# Patient Record
Sex: Male | Born: 1980
Health system: Southern US, Community
[De-identification: ages and names within clinical notes are randomized; demographics above are authoritative.]

## PROBLEM LIST (undated history)

## (undated) DIAGNOSIS — F32A Depression, unspecified: Secondary | ICD-10-CM

## (undated) DIAGNOSIS — J45909 Unspecified asthma, uncomplicated: Secondary | ICD-10-CM

## (undated) DIAGNOSIS — F329 Major depressive disorder, single episode, unspecified: Secondary | ICD-10-CM

## (undated) DIAGNOSIS — IMO0002 Reserved for concepts with insufficient information to code with codable children: Secondary | ICD-10-CM

## (undated) HISTORY — PX: MOUTH SURGERY: SHX715

---

## 1999-09-04 ENCOUNTER — Encounter: Payer: Self-pay | Admitting: Emergency Medicine

## 1999-09-04 ENCOUNTER — Emergency Department (HOSPITAL_COMMUNITY): Admission: EM | Admit: 1999-09-04 | Discharge: 1999-09-04 | Payer: Self-pay | Admitting: Emergency Medicine

## 2000-02-26 ENCOUNTER — Emergency Department (HOSPITAL_COMMUNITY): Admission: EM | Admit: 2000-02-26 | Discharge: 2000-02-26 | Payer: Self-pay | Admitting: Emergency Medicine

## 2000-02-27 ENCOUNTER — Encounter: Payer: Self-pay | Admitting: Emergency Medicine

## 2000-05-30 ENCOUNTER — Emergency Department (HOSPITAL_COMMUNITY): Admission: EM | Admit: 2000-05-30 | Discharge: 2000-05-30 | Payer: Self-pay | Admitting: Emergency Medicine

## 2010-09-13 ENCOUNTER — Emergency Department (HOSPITAL_COMMUNITY): Admission: EM | Admit: 2010-09-13 | Discharge: 2010-09-13 | Payer: Self-pay | Admitting: Family Medicine

## 2013-09-30 ENCOUNTER — Encounter (HOSPITAL_COMMUNITY): Payer: Self-pay | Admitting: Emergency Medicine

## 2013-09-30 ENCOUNTER — Emergency Department (HOSPITAL_COMMUNITY)
Admission: EM | Admit: 2013-09-30 | Discharge: 2013-09-30 | Disposition: A | Discharge status: Home / Self Care | Payer: BC Managed Care – PPO | Source: Home / Self Care

## 2013-09-30 ENCOUNTER — Emergency Department (HOSPITAL_COMMUNITY)
Admission: EM | Admit: 2013-09-30 | Discharge: 2013-09-30 | Disposition: A | Discharge status: Home / Self Care | Payer: BC Managed Care – PPO

## 2013-09-30 DIAGNOSIS — K649 Unspecified hemorrhoids: Secondary | ICD-10-CM

## 2013-09-30 HISTORY — DX: Major depressive disorder, single episode, unspecified: F32.9

## 2013-09-30 HISTORY — DX: Reserved for concepts with insufficient information to code with codable children: IMO0002

## 2013-09-30 HISTORY — DX: Depression, unspecified: F32.A

## 2013-09-30 HISTORY — DX: Unspecified asthma, uncomplicated: J45.909

## 2013-09-30 MED ORDER — TRIAMCINOLONE ACETONIDE 0.1 % EX CREA: 1.0000 "application " | TOPICAL_CREAM | Freq: Two times a day (BID) | CUTANEOUS | Status: AC

## 2013-09-30 MED ORDER — LIDOCAINE HCL 3 % EX CREA: 1.0000 "application " | TOPICAL_CREAM | CUTANEOUS | Status: AC | PRN

## 2013-09-30 NOTE — ED Provider Notes (Signed)
CSN: [id number]     Arrival date & time 09/30/13  1800 History   First MD Initiated Contact with Patient 09/30/13 1937     Chief Complaint  Patient presents with  . Rectal Problems   (Consider location/radiation/quality/duration/timing/severity/associated sxs/prior Treatment) HPI Comments: 32 year old male states he is here for reduction of a rectal prolapse. He was diagnosed with rectal prolapse several years ago. He usually when he experiences a prolapse is able to push it back in but now he states that he is unable to do so and he feels like there is a growth on the outside of his rectum that is painful and feels  Different than usual prolapse.   Past Medical History  Diagnosis Date  . Asthma   . Depression   . Herniated disc    Past Surgical History  Procedure Laterality Date  . Mouth surgery     No family history on file. History  Substance Use Topics  . Smoking status: Former Smoker -- 0.50 packs/day    Types: Cigarettes  . Smokeless tobacco: Not on file  . Alcohol Use: Yes    Review of Systems  Constitutional: Positive for activity change.  Gastrointestinal: Positive for rectal pain. Negative for abdominal pain.  All other systems reviewed and are negative.    Allergies  Aspirin and Penicillins  Home Medications   Current Outpatient Rx  Name  Route  Sig  Dispense  Refill  . lidocaine (LINDAMANTLE) 3 % CREA cream   Topical   Apply 1 application topically as needed.   28 g   0   . triamcinolone cream (KENALOG) 0.1 %   Topical   Apply 1 application topically 2 (two) times daily. Apply for 2 weeks. May use on face   30 g   0    BP 156/73  Pulse 70  Temp(Src) 99 F (37.2 C) (Oral)  Resp 16  SpO2 96% Physical Exam  Nursing note and vitals reviewed. Constitutional: He is oriented to person, place, and time. He appears well-developed.  Neck: Normal range of motion. Neck supple.  Pulmonary/Chest: Effort normal.  Genitourinary:  Solid dermis covered  mass to the external anus. No mucous layer is seen. The covering is congruent with the surrounding skin. Very tender and difficult to manipulate. No bleeding.   Neurological: He is alert and oriented to person, place, and time. He exhibits normal muscle tone.  Skin: Skin is warm and dry.  Psychiatric: He has a normal mood and affect.    ED Course  Procedures (including critical care time) Labs Review Labs Reviewed - No data to display Imaging Review No results found.      MDM   1. Hemorrhoids      Lidocaine cream and triamcinolone cream applied to affected area Must see PCP soon. Will probably need thromboidectomy Sitz bathes  Stool softeners  Hayden Rasmussen, NP 09/30/13 2026

## 2013-09-30 NOTE — ED Notes (Signed)
Pt c/o rectal prolapse onset 11/1... Hx of herniated sphincter  Pain is constant and is 6/10 w/occasional blood on toilet paper.  Alert w/no signs of acute distress.

## 2013-10-28 ENCOUNTER — Ambulatory Visit (INDEPENDENT_AMBULATORY_CARE_PROVIDER_SITE_OTHER): Payer: Self-pay | Admitting: General Surgery

## 2013-10-29 NOTE — ED Provider Notes (Signed)
Medical screening examination/treatment/procedure(s) were performed by resident physician or non-physician practitioner and as supervising physician I was immediately available for consultation/collaboration.   KINDL,JAMES DOUGLAS MD.   James D Kindl, MD 10/29/13 1903 

## 2016-04-20 ENCOUNTER — Emergency Department (HOSPITAL_COMMUNITY)
Admission: EM | Admit: 2016-04-20 | Discharge: 2016-04-20 | Disposition: A | Discharge status: Home / Self Care | Payer: Self-pay | Attending: Emergency Medicine | Admitting: Emergency Medicine

## 2016-04-20 ENCOUNTER — Ambulatory Visit (HOSPITAL_COMMUNITY)
Admission: EM | Admit: 2016-04-20 | Discharge: 2016-04-20 | Disposition: A | Discharge status: Nursing Home (Includes ICF) | Payer: Self-pay | Attending: Family Medicine | Admitting: Family Medicine

## 2016-04-20 ENCOUNTER — Encounter (HOSPITAL_COMMUNITY): Payer: Self-pay

## 2016-04-20 ENCOUNTER — Encounter (HOSPITAL_COMMUNITY): Payer: Self-pay | Admitting: *Deleted

## 2016-04-20 ENCOUNTER — Emergency Department (HOSPITAL_COMMUNITY): Payer: Self-pay

## 2016-04-20 DIAGNOSIS — R0600 Dyspnea, unspecified: Secondary | ICD-10-CM

## 2016-04-20 DIAGNOSIS — E860 Dehydration: Secondary | ICD-10-CM

## 2016-04-20 DIAGNOSIS — R5383 Other fatigue: Secondary | ICD-10-CM | POA: Insufficient documentation

## 2016-04-20 DIAGNOSIS — R079 Chest pain, unspecified: Secondary | ICD-10-CM

## 2016-04-20 DIAGNOSIS — Z8659 Personal history of other mental and behavioral disorders: Secondary | ICD-10-CM | POA: Insufficient documentation

## 2016-04-20 DIAGNOSIS — Z87891 Personal history of nicotine dependence: Secondary | ICD-10-CM | POA: Insufficient documentation

## 2016-04-20 DIAGNOSIS — J45901 Unspecified asthma with (acute) exacerbation: Secondary | ICD-10-CM

## 2016-04-20 DIAGNOSIS — Z720 Tobacco use: Secondary | ICD-10-CM

## 2016-04-20 DIAGNOSIS — Z880 Allergy status to penicillin: Secondary | ICD-10-CM | POA: Insufficient documentation

## 2016-04-20 DIAGNOSIS — Z79899 Other long term (current) drug therapy: Secondary | ICD-10-CM | POA: Insufficient documentation

## 2016-04-20 LAB — I-STAT TROPONIN, ED: Troponin i, poc: 0 ng/mL (ref 0.00–0.08)

## 2016-04-20 LAB — BASIC METABOLIC PANEL
Anion gap: 5 (ref 5–15)
BUN: 10 mg/dL (ref 6–20)
CO2: 28 mmol/L (ref 22–32)
Calcium: 9.3 mg/dL (ref 8.9–10.3)
Chloride: 106 mmol/L (ref 101–111)
Creatinine, Ser: 1.25 mg/dL — ABNORMAL HIGH (ref 0.61–1.24)
GFR calc Af Amer: >60 mL/min (ref >60)
GFR calc non Af Amer: >60 mL/min (ref >60)
Glucose, Bld: 102 mg/dL — ABNORMAL HIGH (ref 65–99)
Potassium: 4.4 mmol/L (ref 3.5–5.1)
Sodium: 139 mmol/L (ref 135–145)

## 2016-04-20 LAB — CBC
HCT: 44.3 % (ref 39.0–52.0)
Hemoglobin: 15 g/dL (ref 13.0–17.0)
MCH: 30.4 pg (ref 26.0–34.0)
MCHC: 33.9 g/dL (ref 30.0–36.0)
MCV: 89.9 fL (ref 78.0–100.0)
Platelets: 241 10*3/uL (ref 150–400)
RBC: 4.93 MIL/uL (ref 4.22–5.81)
RDW: 12.3 % (ref 11.5–15.5)
WBC: 6.4 10*3/uL (ref 4.0–10.5)

## 2016-04-20 LAB — D-DIMER, QUANTITATIVE: D-Dimer, Quant: <0.27 ug/mL-FEU (ref 0.00–0.50)

## 2016-04-20 MED ORDER — SODIUM CHLORIDE 0.9 % IV SOLN: Freq: Once | INTRAVENOUS | Status: DC

## 2016-04-20 MED ORDER — IPRATROPIUM BROMIDE 0.02 % IN SOLN
0.5000 mg | Freq: Once | RESPIRATORY_TRACT | Status: AC
  Administered 2016-04-20: 0.5 mg via RESPIRATORY_TRACT
  Filled 2016-04-20: qty 2.5

## 2016-04-20 MED ORDER — ALBUTEROL SULFATE (2.5 MG/3ML) 0.083% IN NEBU
5.0000 mg | INHALATION_SOLUTION | Freq: Once | RESPIRATORY_TRACT | Status: AC
  Administered 2016-04-20: 5 mg via RESPIRATORY_TRACT
  Filled 2016-04-20: qty 6

## 2016-04-20 MED ORDER — ALBUTEROL SULFATE HFA 108 (90 BASE) MCG/ACT IN AERS: 1.0000 | INHALATION_SPRAY | Freq: Four times a day (QID) | RESPIRATORY_TRACT | Status: AC | PRN

## 2016-04-20 MED ORDER — DEXAMETHASONE 4 MG PO TABS
10.0000 mg | ORAL_TABLET | Freq: Once | ORAL | Status: AC
  Administered 2016-04-20: 10 mg via ORAL
  Filled 2016-04-20: qty 3

## 2016-04-20 MED ORDER — SODIUM CHLORIDE 0.9 % IV BOLUS (SEPSIS)
1000.0000 mL | Freq: Once | INTRAVENOUS | Status: AC
  Administered 2016-04-20: 1000 mL via INTRAVENOUS

## 2016-04-20 NOTE — ED Provider Notes (Signed)
CSN: [id number]     Arrival date & time 04/20/16  1304 History   First MD Initiated Contact with Patient 04/20/16 1325     Chief Complaint  Patient presents with  . Chest Pain   (Consider location/radiation/quality/duration/timing/severity/associated sxs/prior Treatment) HPI Comments: 35 year old male complaining of a discomfort in his chest associated with shallow breathing for the past 2 days. Specifically he describes a heaviness with a weight sitting on his anterior chest and a tightness. It has been constant. Nothing makes it worse, nothing makes it better. [facility] today has blood pressure checked and it was elevated. He states he has been working harder recently and developed fatigue several days ago. He states he feels tired and weak and has labored breathing. He states he feels like he cannot take a good deep breath. Denies nausea, vomiting, diaphoresis or DOE. He states he has not exerted himself in the past couple days to really test for exertional symptoms. He has a history of smoking one pack per week. He has a history of asthma and bronchitis. Denies history of known cardiac disease.  In addition his [facility] is [facility] just had a fourth child was C-section. She was told that some point recently she has contracted hepatitis B. The patient is asking for a hepatitis B test.   Past Medical History  Diagnosis Date  . Asthma   . Depression   . Herniated disc    Past Surgical History  Procedure Laterality Date  . Mouth surgery     History reviewed. No pertinent family history. Social History  Substance Use Topics  . Smoking status: Former Smoker -- 0.50 packs/day    Types: Cigarettes  . Smokeless tobacco: None  . Alcohol Use: Yes    Review of Systems  Constitutional: Positive for activity change, appetite change and fatigue. Negative for fever, chills and diaphoresis.  HENT: Negative.   Eyes: Negative.   Respiratory: Positive for chest  tightness and shortness of breath. Negative for cough.   Cardiovascular: Positive for chest pain and palpitations. Negative for leg swelling.  Gastrointestinal: Negative.   Genitourinary: Negative.   Musculoskeletal: Negative.   Skin: Negative.   Neurological: Negative.   Psychiatric/Behavioral:       Patient endorses increase stress and anxiety.    Allergies  Aspirin and Penicillins  Home Medications   Prior to Admission medications   Medication Sig Start Date End Date Taking? Authorizing Provider  lidocaine (LINDAMANTLE) 3 % CREA cream Apply 1 application topically as needed. 09/30/13   Hayden Rasmussenavid Evalina Tabak, NP  triamcinolone cream (KENALOG) 0.1 % Apply 1 application topically 2 (two) times daily. Apply for 2 weeks. May use on face 09/30/13   Hayden Rasmussenavid Kirill Chatterjee, NP   Meds Ordered and Administered this Visit   Medications  0.9 %  sodium chloride infusion (not administered)    There were no vitals taken for this visit. No data found.   Physical Exam  Constitutional: He is oriented to person, place, and time. He appears well-developed and well-nourished. No distress.  HENT:  Head: Normocephalic and atraumatic.  Eyes: Conjunctivae and EOM are normal.  Neck: Normal range of motion. Neck supple.  Cardiovascular: Normal rate, regular rhythm, normal heart sounds and intact distal pulses.   No murmur heard. Pulmonary/Chest: Effort normal. No respiratory distress. He has wheezes. He has no rales.  Few Expiratory wheezes. Mildly prolonged expiratory phase.  Abdominal: Soft. There is no tenderness.  Musculoskeletal: He exhibits no edema.  Lymphadenopathy:  He has no cervical adenopathy.  Neurological: He is alert and oriented to person, place, and time. No cranial nerve deficit. He exhibits normal muscle tone.  Skin: Skin is warm and dry.  Psychiatric: He has a normal mood and affect. His behavior is normal.  Nursing note and vitals reviewed.   ED Course  Procedures (including critical care  time)  Labs Review Labs Reviewed - No data to display  Imaging Review No results found.  ED ECG REPORT   Date: 04/20/2016  Rate: 69  Rhythm: normal sinus rhythm  QRS Axis: normal  Intervals: normal  ST/T Wave abnormalities: nonspecific ST/T changes  Conduction Disutrbances:none  Narrative Interpretation:   Old EKG Reviewed: none available  I have personally reviewed the EKG tracing and agree with the computerized printout as noted.  Visual Acuity Review  Right Eye Distance:   Left Eye Distance:   Bilateral Distance:    Right Eye Near:   Left Eye Near:    Bilateral Near:         MDM   1. Chest pain, unspecified chest pain type   2. Dyspnea   3. Other fatigue   4. Tobacco abuse disorder    Transfer to  via care Link for evaluation of chest heaviness and tightness associated with dyspnea and inability to take a good deep breath, and fatigue. Risk factors include smoking. Pt is also requesting Hep B testing lin light of wife just learning of her positivity. IV, O2, Monitor,      Hayden Rasmussen, NP 04/20/16 1437  Hayden Rasmussen, NP 04/20/16 1441

## 2016-04-20 NOTE — ED Notes (Signed)
[facility].  Pt reports cold symptoms x2 days as well and has been taking Zyrtec for symptoms.

## 2016-04-20 NOTE — ED Provider Notes (Signed)
CSN: [id number]     Arrival date & time 04/20/16  1515 History   First MD Initiated Contact with Patient 04/20/16 1526     Chief Complaint  Patient presents with  . Chest Pain     (Consider location/radiation/quality/duration/timing/severity/associated sxs/prior Treatment) HPI Comments: 35 y.o. Male with history of asthma, smoker presents for chest pain.  The patient reports that over the last 2 days he has had heaviness in his left chest.  He also reports he has not been able to take a full deep breath.  The patient reports he has also felt extremely fatigued over this time.  Denies leg swelling, long trips, immobility.  Never had symptoms like this before.  HE says his wife just had their 4th child via c-section and is still at the [facility] and that they were told after the delivery she tested positive for Hep B.  He says he had to leave the hospital because of the chest discomfort and tried to go home and get a good night's sleep but woke up still feeling the chest discomfort and so sought medical evaluation initially at the urgent care but was then sent here for further evaluation.  He says the nurses at the [facility] hospital had told him his BP was elevated all the way to the 150s systolic.  Patient denies significant known cardiac history in his family.  He does have a primary care physician.  He was given ASA and Nitro before being sent to the ER.   Past Medical History  Diagnosis Date  . Asthma   . Depression   . Herniated disc    Past Surgical History  Procedure Laterality Date  . Mouth surgery     History reviewed. No pertinent family history. Social History  Substance Use Topics  . Smoking status: Former Smoker -- 0.50 packs/day    Types: Cigarettes  . Smokeless tobacco: None  . Alcohol Use: Yes    Review of Systems  Constitutional: Positive for activity change and fatigue. Negative for fever and appetite change.  HENT: Positive for congestion and sinus pressure.  Negative for postnasal drip and sore throat.   Eyes: Negative for visual disturbance.  Respiratory: Positive for shortness of breath. Negative for cough, chest tightness and wheezing.   Cardiovascular: Positive for chest pain. Negative for palpitations and leg swelling.  Gastrointestinal: Negative for nausea, vomiting, abdominal pain, diarrhea and constipation.  Genitourinary: Negative for dysuria, urgency and frequency.  Musculoskeletal: Negative for myalgias and back pain.  Skin: Negative for rash.  Neurological: Negative for dizziness, weakness and headaches.  Hematological: Does not bruise/bleed easily.      Allergies  Aspirin and Penicillins  Home Medications   Prior to Admission medications   Medication Sig Start Date End Date Taking? Authorizing Provider  cetirizine (ZYRTEC) 10 MG tablet Take 10 mg by mouth daily.   Yes Historical Provider, MD  albuterol (PROVENTIL HFA;VENTOLIN HFA) 108 (90 Base) MCG/ACT inhaler Inhale 1-2 puffs into the lungs every 6 (six) hours as needed for wheezing or shortness of breath. 04/20/16   Leta Baptist, MD  lidocaine [facility]) 3 % CREA cream Apply 1 application topically as needed. Patient not taking: Reported on 04/20/2016 09/30/13   Hayden Rasmussen, NP  triamcinolone cream (KENALOG) 0.1 % Apply 1 application topically 2 (two) times daily. Apply for 2 weeks. May use on face Patient not taking: Reported on 04/20/2016 09/30/13   Hayden Rasmussen, NP   BP 138/83 mmHg  Pulse 68  Temp(Src)  97.8 F (36.6 C) (Oral)  Resp 14  Ht 6\' 3"  (1.905 m)  Wt 210 lb (95.255 kg)  BMI 26.25 kg/m2  SpO2 100% Physical Exam  Constitutional: He is oriented to person, place, and time. He appears well-developed and well-nourished. No distress.  HENT:  Head: Normocephalic and atraumatic.  Right Ear: External ear normal.  Left Ear: External ear normal.  Mouth/Throat: Oropharynx is clear and moist. No oropharyngeal exudate.  Eyes: EOM are normal. Pupils are equal, round,  and reactive to light.  Neck: Normal range of motion. Neck supple.  Cardiovascular: Normal rate, regular rhythm, normal heart sounds and intact distal pulses.   No murmur heard. Pulmonary/Chest: Effort normal. No respiratory distress. He has wheezes (very few scattered). He has no rales.  Mildly decreased air movement in both lungs that is symmetric  Abdominal: Soft. He exhibits no distension. There is no tenderness.  Musculoskeletal: He exhibits no edema.  Neurological: He is alert and oriented to person, place, and time.  Skin: Skin is warm and dry. No rash noted. He is not diaphoretic.  Vitals reviewed.   ED Course  Procedures (including critical care time) Labs Review Labs Reviewed  BASIC METABOLIC PANEL - Abnormal; Notable for the following:    Glucose, Bld 102 (*)    Creatinine, Ser 1.25 (*)    All other components within normal limits  CBC  D-DIMER, QUANTITATIVE (NOT AT [facility])  Rosezena SensorI-STAT TROPOININ, ED    Imaging Review Dg Chest Portable 1 View  04/20/2016  CLINICAL DATA:  Weakness, labored breathing, chest tightness, and increased blood pressure for 48 hours, history asthma, smoking EXAM: PORTABLE CHEST 1 VIEW COMPARISON:  Portable exam 1545 hours without priors for comparison FINDINGS: Normal heart size, mediastinal contours, and pulmonary vascularity. Lungs clear. No pleural effusion or pneumothorax. Bones unremarkable. IMPRESSION: No acute abnormalities. Electronically Signed   By: Ulyses SouthwardMark  Boles M.D.   On: 04/20/2016 16:08   I have personally reviewed and evaluated these images and lab results as part of my medical decision-making.   EKG Interpretation   Date/Time:  Thursday Apr 20 2016 15:25:41 EDT Ventricular Rate:  68 PR Interval:  160 QRS Duration: 92 QT Interval:  388 QTC Calculation: 413 R Axis:   56 Text Interpretation:  Sinus rhythm Borderline T wave abnormalities No  significant change since last tracing Confirmed by NGUYEN, EMILY ([id number])  on 04/20/2016 3:38:57  PM Also confirmed by Cyndie ChimeNGUYEN, EMILY ([id number]), editor  Dan HumphreysWALKER, CCT, SANDRA ([id number])  on 04/20/2016 3:54:42 PM      MDM  Patient was seen and evaluated in stable condition.  EKG without significant change from previous.  Troponin, D dimer unremarkable.  Chest xray unremarkable.  After breathing treatment patient with improved air movement and reported improvement in his discomfort and shortness of breath and was given dose of prednisone.  Cr mildly elevated and patient given fluid bolus.  All results, clinical impression discussed with patient at bedside.  HeartScore 2-3 making patient low risk for cardiac event.  This was discussed with patient.  Also discussed the improvement after breathing treatment and the possibility that symptoms are related to allergies and asthma with patient also experiencing sinus fullness.  Patient felt comfortable with plan for discharge with outpatient follow up with his PCP.  He was provided an inhaler and given a prescription for prednisone.  Strict return precautions given.  Patient to also have evaluation for Hep B done through his PCP. Final diagnoses:  Chest pain, unspecified chest pain type  Asthma, unspecified asthma severity, with acute exacerbation  Dehydration    1. Chest pain  2. Asthma    Leta Baptist, MD 04/21/16 [phone]

## 2016-04-20 NOTE — Discharge Instructions (Signed)
You were seen and evaluated today for your chest pain and shortness of breath. Likely this is due to to some component of asthma. You were given a dose of steroids that are long-acting in the emergency department. You do not need to take any further steroids. Please fill the prescription for the inhaler and use it as needed for shortness of breath. Follow-up with your primary care physician. You are also mildly dehydrated and her creatinine which is her kidney function was 1.3. You were given fluids to help with this. Please drink lots of water at home. Also follow up with your primary care physician for reevaluation of this.   Nonspecific Chest Pain  Chest pain can be caused by many different conditions. There is always a chance that your pain could be related to something serious, such as a heart attack or a blood clot in your lungs. Chest pain can also be caused by conditions that are not life-threatening. If you have chest pain, it is very important to follow up with your health care provider. CAUSES  Chest pain can be caused by:  Heartburn.  Pneumonia or bronchitis.  Anxiety or stress.  Inflammation around your heart (pericarditis) or lung (pleuritis or pleurisy).  A blood clot in your lung.  A collapsed lung (pneumothorax). It can develop suddenly on its own (spontaneous pneumothorax) or from trauma to the chest.  Shingles infection (varicella-zoster virus).  Heart attack.  Damage to the bones, muscles, and cartilage that make up your chest wall. This can include:  Bruised bones due to injury.  Strained muscles or cartilage due to frequent or repeated coughing or overwork.  Fracture to one or more ribs.  Sore cartilage due to inflammation (costochondritis). RISK FACTORS  Risk factors for chest pain may include:  Activities that increase your risk for trauma or injury to your chest.  Respiratory infections or conditions that cause frequent coughing.  Medical conditions or  overeating that can cause heartburn.  Heart disease or family history of heart disease.  Conditions or health behaviors that increase your risk of developing a blood clot.  Having had chicken pox (varicella zoster). SIGNS AND SYMPTOMS Chest pain can feel like:  Burning or tingling on the surface of your chest or deep in your chest.  Crushing, pressure, aching, or squeezing pain.  Dull or sharp pain that is worse when you move, cough, or take a deep breath.  Pain that is also felt in your back, neck, shoulder, or arm, or pain that spreads to any of these areas. Your chest pain may come and go, or it may stay constant. DIAGNOSIS Lab tests or other studies may be needed to find the cause of your pain. Your health care provider may have you take a test called an ambulatory ECG (electrocardiogram). An ECG records your heartbeat patterns at the time the test is performed. You may also have other tests, such as:  Transthoracic echocardiogram (TTE). During echocardiography, sound waves are used to create a picture of all of the heart structures and to look at how blood flows through your heart.  Transesophageal echocardiogram (TEE).This is a more advanced imaging test that obtains images from inside your body. It allows your health care provider to see your heart in finer detail.  Cardiac monitoring. This allows your health care provider to monitor your heart rate and rhythm in real time.  Holter monitor. This is a portable device that records your heartbeat and can help to diagnose abnormal heartbeats. It allows  your health care provider to track your heart activity for several days, if needed.  Stress tests. These can be done through exercise or by taking medicine that makes your heart beat more quickly.  Blood tests.  Imaging tests. TREATMENT  Your treatment depends on what is causing your chest pain. Treatment may include:  Medicines. These may include:  Acid blockers for  heartburn.  Anti-inflammatory medicine.  Pain medicine for inflammatory conditions.  Antibiotic medicine, if an infection is present.  Medicines to dissolve blood clots.  Medicines to treat coronary artery disease.  Supportive care for conditions that do not require medicines. This may include:  Resting.  Applying heat or cold packs to injured areas.  Limiting activities until pain decreases. HOME CARE INSTRUCTIONS  If you were prescribed an antibiotic medicine, finish it all even if you start to feel better.  Avoid any activities that bring on chest pain.  Do not use any tobacco products, including cigarettes, chewing tobacco, or electronic cigarettes. If you need help quitting, ask your health care provider.  Do not drink alcohol.  Take medicines only as directed by your health care provider.  Keep all follow-up visits as directed by your health care provider. This is important. This includes any further testing if your chest pain does not go away.  If heartburn is the cause for your chest pain, you may be told to keep your head raised (elevated) while sleeping. This reduces the chance that acid will go from your stomach into your esophagus.  Make lifestyle changes as directed by your health care provider. These may include:  Getting regular exercise. Ask your health care provider to suggest some activities that are safe for you.  Eating a heart-healthy diet. A registered dietitian can help you to learn healthy eating options.  Maintaining a healthy weight.  Managing diabetes, if necessary.  Reducing stress. SEEK MEDICAL CARE IF:  Your chest pain does not go away after treatment.  You have a rash with blisters on your chest.  You have a fever. SEEK IMMEDIATE MEDICAL CARE IF:   Your chest pain is worse.  You have an increasing cough, or you cough up blood.  You have severe abdominal pain.  You have severe weakness.  You faint.  You have chills.  You  have sudden, unexplained chest discomfort.  You have sudden, unexplained discomfort in your arms, back, neck, or jaw.  You have shortness of breath at any time.  You suddenly start to sweat, or your skin gets clammy.  You feel nauseous or you vomit.  You suddenly feel light-headed or dizzy.  Your heart begins to beat quickly, or it feels like it is skipping beats. These symptoms may represent a serious problem that is an emergency. Do not wait to see if the symptoms will go away. Get medical help right away. Call your local emergency services (911 in the [place] Do not drive yourself to the hospital.   This information is not intended to replace advice given to you by your health care provider. Make sure you discuss any questions you have with your health care provider.   Document Released: 08/23/2005 Document Revised: 12/04/2014 Document Reviewed: 06/19/2014 Elsevier Interactive Patient Education 2016 Else[employer] Inc[employer]   Asthma, Adult Asthma is a recurring condition in which the airways tighten and narrow. Asthma can make it difficult to breathe. It can cause coughing, wheezing, and shortness of breath. Asthma episodes, also called asthma attacks, range from minor to life-threatening. Asthma cannot be cured,  but medicines and lifestyle changes can help control it. CAUSES Asthma is believed to be caused by inherited (genetic) and environmental factors, but its exact cause is unknown. Asthma may be triggered by allergens, lung infections, or irritants in the air. Asthma triggers are different for each person. Common triggers include:   Animal dander.  Dust mites.  Cockroaches.  Pollen from trees or grass.  Mold.  Smoke.  Air pollutants such as dust, household cleaners, hair sprays, aerosol sprays, paint fumes, strong chemicals, or strong odors.  Cold air, weather changes, and winds (which increase molds and pollens in the air).  Strong emotional expressions such as crying or  laughing hard.  Stress.  Certain medicines (such as aspirin) or types of drugs (such as beta-blockers).  Sulfites in foods and drinks. Foods and drinks that may contain sulfites include dried fruit, potato chips, and sparkling grape juice.  Infections or inflammatory conditions such as the flu, a cold, or an inflammation of the nasal membranes (rhinitis).  Gastroesophageal reflux disease (GERD).  Exercise or strenuous activity. SYMPTOMS Symptoms may occur immediately after asthma is triggered or many hours later. Symptoms include:  Wheezing.  Excessive nighttime or early morning coughing.  Frequent or severe coughing with a common cold.  Chest tightness.  Shortness of breath. DIAGNOSIS  The diagnosis of asthma is made by a review of your medical history and a physical exam. Tests may also be performed. These may include:  Lung function studies. These tests show how much air you breathe in and out.  Allergy tests.  Imaging tests such as X-rays. TREATMENT  Asthma cannot be cured, but it can usually be controlled. Treatment involves identifying and avoiding your asthma triggers. It also involves medicines. There are 2 classes of medicine used for asthma treatment:   Controller medicines. These prevent asthma symptoms from occurring. They are usually taken every day.  Reliever or rescue medicines. These quickly relieve asthma symptoms. They are used as needed and provide short-term relief. Your health care provider will help you create an asthma action plan. An asthma action plan is a written plan for managing and treating your asthma attacks. It includes a list of your asthma triggers and how they may be avoided. It also includes information on when medicines should be taken and when their dosage should be changed. An action plan may also involve the use of a device called a peak flow meter. A peak flow meter measures how well the lungs are working. It helps you monitor your  condition. HOME CARE INSTRUCTIONS   Take medicines only as directed by your health care provider. Speak with your health care provider if you have questions about how or when to take the medicines.  Use a peak flow meter as directed by your health care provider. Record and keep track of readings.  Understand and use the action plan to help minimize or stop an asthma attack without needing to seek medical care.  Control your home environment in the following ways to help prevent asthma attacks:  Do not smoke. Avoid being exposed to secondhand smoke.  Change your heating and air conditioning filter regularly.  Limit your use of fireplaces and wood stoves.  Get rid of pests (such as roaches and mice) and their droppings.  Throw away plants if you see mold on them.  Clean your floors and dust regularly. Use unscented cleaning products.  Try to have someone else vacuum for you regularly. Stay out of rooms while they are being  vacuumed and for a short while afterward. If you vacuum, use a dust mask from a hardware store, a double-layered or microfilter vacuum cleaner bag, or a vacuum cleaner with a HEPA filter.  Replace carpet with wood, tile, or vinyl flooring. Carpet can trap dander and dust.  Use allergy-proof pillows, mattress covers, and box spring covers.  Wash bed sheets and blankets every week in hot water and dry them in a dryer.  Use blankets that are made of polyester or cotton.  Clean bathrooms and kitchens with bleach. If possible, have someone repaint the walls in these rooms with mold-resistant paint. Keep out of the rooms that are being cleaned and painted.  Wash hands frequently. SEEK MEDICAL CARE IF:   You have wheezing, shortness of breath, or a cough even if taking medicine to prevent attacks.  The colored mucus you cough up (sputum) is thicker than usual.  Your sputum changes from clear or white to yellow, green, gray, or bloody.  You have any problems that  may be related to the medicines you are taking (such as a rash, itching, swelling, or trouble breathing).  You are using a reliever medicine more than 2-3 times per week.  Your peak flow is still at 50-79% of your personal best after following your action plan for 1 hour.  You have a fever. SEEK IMMEDIATE MEDICAL CARE IF:   You seem to be getting worse and are unresponsive to treatment during an asthma attack.  You are short of breath even at rest.  You get short of breath when doing very little physical activity.  You have difficulty eating, drinking, or talking due to asthma symptoms.  You develop chest pain.  You develop a fast heartbeat.  You have a bluish color to your lips or fingernails.  You are light-headed, dizzy, or faint.  Your peak flow is less than 50% of your personal best.   This information is not intended to replace advice given to you by your health care provider. Make sure you discuss any questions you have with your health care provider.   Document Released: 11/13/2005 Document Revised: 08/04/2015 Document Reviewed: 06/12/2013 Elsevier Interactive Patient Education [employer]

## 2016-04-20 NOTE — ED Notes (Signed)
Placed  On  Cardiac   Monitor         Nasal  o2   At       2  l  /   Min  20 angio  r  Hand  1  Att     saline  Theodoro Doing[employer]ok

## 2016-04-20 NOTE — ED Notes (Signed)
Pt  Reports   Chest      Pressure     For  sev  Days         Wife  Recently     Diagnosed   With  Hep  B   -    Pt        Reports      History  Of  Back  Pain       Asthma    And  Bronchitis

## 2017-08-30 DIAGNOSIS — H5213 Myopia, bilateral: Secondary | ICD-10-CM | POA: Diagnosis not present

## 2018-03-20 DIAGNOSIS — F330 Major depressive disorder, recurrent, mild: Secondary | ICD-10-CM | POA: Diagnosis not present

## 2018-03-20 DIAGNOSIS — F17210 Nicotine dependence, cigarettes, uncomplicated: Secondary | ICD-10-CM | POA: Diagnosis not present

## 2018-03-20 DIAGNOSIS — J45909 Unspecified asthma, uncomplicated: Secondary | ICD-10-CM | POA: Diagnosis not present

## 2018-05-09 DIAGNOSIS — M47812 Spondylosis without myelopathy or radiculopathy, cervical region: Secondary | ICD-10-CM | POA: Diagnosis not present

## 2018-05-09 DIAGNOSIS — M9902 Segmental and somatic dysfunction of thoracic region: Secondary | ICD-10-CM | POA: Diagnosis not present

## 2018-05-09 DIAGNOSIS — M5032 Other cervical disc degeneration, mid-cervical region, unspecified level: Secondary | ICD-10-CM | POA: Diagnosis not present

## 2018-05-09 DIAGNOSIS — M9901 Segmental and somatic dysfunction of cervical region: Secondary | ICD-10-CM | POA: Diagnosis not present

## 2018-05-16 DIAGNOSIS — M9901 Segmental and somatic dysfunction of cervical region: Secondary | ICD-10-CM | POA: Diagnosis not present

## 2018-05-16 DIAGNOSIS — M5032 Other cervical disc degeneration, mid-cervical region, unspecified level: Secondary | ICD-10-CM | POA: Diagnosis not present

## 2018-05-16 DIAGNOSIS — M47812 Spondylosis without myelopathy or radiculopathy, cervical region: Secondary | ICD-10-CM | POA: Diagnosis not present

## 2018-05-16 DIAGNOSIS — M9902 Segmental and somatic dysfunction of thoracic region: Secondary | ICD-10-CM | POA: Diagnosis not present

## 2018-05-23 DIAGNOSIS — M47812 Spondylosis without myelopathy or radiculopathy, cervical region: Secondary | ICD-10-CM | POA: Diagnosis not present

## 2018-05-23 DIAGNOSIS — M9902 Segmental and somatic dysfunction of thoracic region: Secondary | ICD-10-CM | POA: Diagnosis not present

## 2018-05-23 DIAGNOSIS — M5032 Other cervical disc degeneration, mid-cervical region, unspecified level: Secondary | ICD-10-CM | POA: Diagnosis not present

## 2018-05-23 DIAGNOSIS — M9901 Segmental and somatic dysfunction of cervical region: Secondary | ICD-10-CM | POA: Diagnosis not present

## 2018-05-27 DIAGNOSIS — M9902 Segmental and somatic dysfunction of thoracic region: Secondary | ICD-10-CM | POA: Diagnosis not present

## 2018-05-27 DIAGNOSIS — M47812 Spondylosis without myelopathy or radiculopathy, cervical region: Secondary | ICD-10-CM | POA: Diagnosis not present

## 2018-05-27 DIAGNOSIS — M9901 Segmental and somatic dysfunction of cervical region: Secondary | ICD-10-CM | POA: Diagnosis not present

## 2018-05-27 DIAGNOSIS — M5032 Other cervical disc degeneration, mid-cervical region, unspecified level: Secondary | ICD-10-CM | POA: Diagnosis not present

## 2018-05-28 DIAGNOSIS — M47812 Spondylosis without myelopathy or radiculopathy, cervical region: Secondary | ICD-10-CM | POA: Diagnosis not present

## 2018-05-28 DIAGNOSIS — M5032 Other cervical disc degeneration, mid-cervical region, unspecified level: Secondary | ICD-10-CM | POA: Diagnosis not present

## 2018-05-28 DIAGNOSIS — M9902 Segmental and somatic dysfunction of thoracic region: Secondary | ICD-10-CM | POA: Diagnosis not present

## 2018-05-28 DIAGNOSIS — M9901 Segmental and somatic dysfunction of cervical region: Secondary | ICD-10-CM | POA: Diagnosis not present

## 2018-06-13 DIAGNOSIS — M5032 Other cervical disc degeneration, mid-cervical region, unspecified level: Secondary | ICD-10-CM | POA: Diagnosis not present

## 2018-06-13 DIAGNOSIS — M47812 Spondylosis without myelopathy or radiculopathy, cervical region: Secondary | ICD-10-CM | POA: Diagnosis not present

## 2018-06-13 DIAGNOSIS — M9901 Segmental and somatic dysfunction of cervical region: Secondary | ICD-10-CM | POA: Diagnosis not present

## 2018-06-13 DIAGNOSIS — M9902 Segmental and somatic dysfunction of thoracic region: Secondary | ICD-10-CM | POA: Diagnosis not present

## 2018-06-19 DIAGNOSIS — M47812 Spondylosis without myelopathy or radiculopathy, cervical region: Secondary | ICD-10-CM | POA: Diagnosis not present

## 2018-06-19 DIAGNOSIS — M9901 Segmental and somatic dysfunction of cervical region: Secondary | ICD-10-CM | POA: Diagnosis not present

## 2018-06-19 DIAGNOSIS — M9902 Segmental and somatic dysfunction of thoracic region: Secondary | ICD-10-CM | POA: Diagnosis not present

## 2018-06-19 DIAGNOSIS — M5032 Other cervical disc degeneration, mid-cervical region, unspecified level: Secondary | ICD-10-CM | POA: Diagnosis not present

## 2018-06-20 DIAGNOSIS — M9902 Segmental and somatic dysfunction of thoracic region: Secondary | ICD-10-CM | POA: Diagnosis not present

## 2018-06-20 DIAGNOSIS — M9901 Segmental and somatic dysfunction of cervical region: Secondary | ICD-10-CM | POA: Diagnosis not present

## 2018-06-20 DIAGNOSIS — M5032 Other cervical disc degeneration, mid-cervical region, unspecified level: Secondary | ICD-10-CM | POA: Diagnosis not present

## 2018-06-20 DIAGNOSIS — M47812 Spondylosis without myelopathy or radiculopathy, cervical region: Secondary | ICD-10-CM | POA: Diagnosis not present

## 2018-06-24 DIAGNOSIS — M47812 Spondylosis without myelopathy or radiculopathy, cervical region: Secondary | ICD-10-CM | POA: Diagnosis not present

## 2018-06-24 DIAGNOSIS — M9901 Segmental and somatic dysfunction of cervical region: Secondary | ICD-10-CM | POA: Diagnosis not present

## 2018-06-24 DIAGNOSIS — M5032 Other cervical disc degeneration, mid-cervical region, unspecified level: Secondary | ICD-10-CM | POA: Diagnosis not present

## 2018-06-24 DIAGNOSIS — M9902 Segmental and somatic dysfunction of thoracic region: Secondary | ICD-10-CM | POA: Diagnosis not present

## 2018-06-26 DIAGNOSIS — M9902 Segmental and somatic dysfunction of thoracic region: Secondary | ICD-10-CM | POA: Diagnosis not present

## 2018-06-26 DIAGNOSIS — M9901 Segmental and somatic dysfunction of cervical region: Secondary | ICD-10-CM | POA: Diagnosis not present

## 2018-06-26 DIAGNOSIS — M5032 Other cervical disc degeneration, mid-cervical region, unspecified level: Secondary | ICD-10-CM | POA: Diagnosis not present

## 2018-06-26 DIAGNOSIS — M47812 Spondylosis without myelopathy or radiculopathy, cervical region: Secondary | ICD-10-CM | POA: Diagnosis not present

## 2018-09-25 DIAGNOSIS — Z0000 Encounter for general adult medical examination without abnormal findings: Secondary | ICD-10-CM | POA: Diagnosis not present

## 2018-09-25 DIAGNOSIS — J45909 Unspecified asthma, uncomplicated: Secondary | ICD-10-CM | POA: Diagnosis not present

## 2018-09-25 DIAGNOSIS — Z23 Encounter for immunization: Secondary | ICD-10-CM | POA: Diagnosis not present

## 2018-09-25 DIAGNOSIS — Z13220 Encounter for screening for lipoid disorders: Secondary | ICD-10-CM | POA: Diagnosis not present

## 2018-09-25 DIAGNOSIS — M25512 Pain in left shoulder: Secondary | ICD-10-CM | POA: Diagnosis not present

## 2018-09-25 DIAGNOSIS — F330 Major depressive disorder, recurrent, mild: Secondary | ICD-10-CM | POA: Diagnosis not present

## 2018-09-25 DIAGNOSIS — B009 Herpesviral infection, unspecified: Secondary | ICD-10-CM | POA: Diagnosis not present

## 2018-10-17 DIAGNOSIS — S83242A Other tear of medial meniscus, current injury, left knee, initial encounter: Secondary | ICD-10-CM | POA: Diagnosis not present

## 2018-10-17 DIAGNOSIS — M67912 Unspecified disorder of synovium and tendon, left shoulder: Secondary | ICD-10-CM | POA: Diagnosis not present

## 2018-10-17 DIAGNOSIS — M4722 Other spondylosis with radiculopathy, cervical region: Secondary | ICD-10-CM | POA: Diagnosis not present

## 2018-10-22 DIAGNOSIS — M67912 Unspecified disorder of synovium and tendon, left shoulder: Secondary | ICD-10-CM | POA: Diagnosis not present

## 2018-10-22 DIAGNOSIS — M4722 Other spondylosis with radiculopathy, cervical region: Secondary | ICD-10-CM | POA: Diagnosis not present

## 2018-10-22 DIAGNOSIS — S83242A Other tear of medial meniscus, current injury, left knee, initial encounter: Secondary | ICD-10-CM | POA: Diagnosis not present

## 2019-07-03 DIAGNOSIS — Z03818 Encounter for observation for suspected exposure to other biological agents ruled out: Secondary | ICD-10-CM | POA: Diagnosis not present

## 2019-07-03 DIAGNOSIS — U071 COVID-19: Secondary | ICD-10-CM | POA: Diagnosis not present

## 2020-02-08 DIAGNOSIS — Z20828 Contact with and (suspected) exposure to other viral communicable diseases: Secondary | ICD-10-CM | POA: Diagnosis not present

## 2020-02-16 DIAGNOSIS — Z20822 Contact with and (suspected) exposure to covid-19: Secondary | ICD-10-CM | POA: Diagnosis not present

## 2020-05-31 DIAGNOSIS — H5213 Myopia, bilateral: Secondary | ICD-10-CM | POA: Diagnosis not present

## 2020-07-09 DIAGNOSIS — Z03818 Encounter for observation for suspected exposure to other biological agents ruled out: Secondary | ICD-10-CM | POA: Diagnosis not present

## 2020-10-09 DIAGNOSIS — Z20828 Contact with and (suspected) exposure to other viral communicable diseases: Secondary | ICD-10-CM | POA: Diagnosis not present

## 2020-10-26 DIAGNOSIS — Z23 Encounter for immunization: Secondary | ICD-10-CM | POA: Diagnosis not present

## 2020-10-26 DIAGNOSIS — Z20828 Contact with and (suspected) exposure to other viral communicable diseases: Secondary | ICD-10-CM | POA: Diagnosis not present

## 2021-01-31 DIAGNOSIS — F17210 Nicotine dependence, cigarettes, uncomplicated: Secondary | ICD-10-CM | POA: Diagnosis not present

## 2021-01-31 DIAGNOSIS — J45909 Unspecified asthma, uncomplicated: Secondary | ICD-10-CM | POA: Diagnosis not present

## 2021-01-31 DIAGNOSIS — Z0000 Encounter for general adult medical examination without abnormal findings: Secondary | ICD-10-CM | POA: Diagnosis not present

## 2021-01-31 DIAGNOSIS — M25562 Pain in left knee: Secondary | ICD-10-CM | POA: Diagnosis not present

## 2021-01-31 DIAGNOSIS — B009 Herpesviral infection, unspecified: Secondary | ICD-10-CM | POA: Diagnosis not present

## 2021-01-31 DIAGNOSIS — Z125 Encounter for screening for malignant neoplasm of prostate: Secondary | ICD-10-CM | POA: Diagnosis not present

## 2021-01-31 DIAGNOSIS — Z13220 Encounter for screening for lipoid disorders: Secondary | ICD-10-CM | POA: Diagnosis not present

## 2021-02-07 ENCOUNTER — Telehealth: Payer: Self-pay | Admitting: Genetic Counselor

## 2021-02-07 NOTE — Telephone Encounter (Signed)
Received a genetic counseling referral from Dr. Wynelle Link for family history of malignant neoplasm. Pt has been cld and scheduled to see Cari on 3/28 at 9am. Pt aware to arrive 20 minutes early.

## 2021-02-15 DIAGNOSIS — S83242A Other tear of medial meniscus, current injury, left knee, initial encounter: Secondary | ICD-10-CM | POA: Diagnosis not present

## 2021-02-21 ENCOUNTER — Other Ambulatory Visit: Payer: Self-pay

## 2021-02-21 ENCOUNTER — Inpatient Hospital Stay: Payer: BC Managed Care – PPO

## 2021-02-21 ENCOUNTER — Inpatient Hospital Stay: Payer: BC Managed Care – PPO | Attending: Genetic Counselor | Admitting: Genetic Counselor

## 2021-02-21 DIAGNOSIS — Z8481 Family history of carrier of genetic disease: Secondary | ICD-10-CM | POA: Diagnosis not present

## 2021-02-21 DIAGNOSIS — Z801 Family history of malignant neoplasm of trachea, bronchus and lung: Secondary | ICD-10-CM | POA: Diagnosis not present

## 2021-02-21 DIAGNOSIS — Z8042 Family history of malignant neoplasm of prostate: Secondary | ICD-10-CM

## 2021-02-21 DIAGNOSIS — Z800 Family history of malignant neoplasm of digestive organs: Secondary | ICD-10-CM

## 2021-02-21 DIAGNOSIS — Z8041 Family history of malignant neoplasm of ovary: Secondary | ICD-10-CM | POA: Diagnosis not present

## 2021-02-21 DIAGNOSIS — Z806 Family history of leukemia: Secondary | ICD-10-CM

## 2021-02-21 DIAGNOSIS — Z803 Family history of malignant neoplasm of breast: Secondary | ICD-10-CM

## 2021-02-22 ENCOUNTER — Encounter: Payer: Self-pay | Admitting: Genetic Counselor

## 2021-02-22 DIAGNOSIS — Z8042 Family history of malignant neoplasm of prostate: Secondary | ICD-10-CM

## 2021-02-22 DIAGNOSIS — M25562 Pain in left knee: Secondary | ICD-10-CM | POA: Diagnosis not present

## 2021-02-22 DIAGNOSIS — Z8041 Family history of malignant neoplasm of ovary: Secondary | ICD-10-CM

## 2021-02-22 DIAGNOSIS — Z800 Family history of malignant neoplasm of digestive organs: Secondary | ICD-10-CM

## 2021-02-22 DIAGNOSIS — Z803 Family history of malignant neoplasm of breast: Secondary | ICD-10-CM

## 2021-02-22 DIAGNOSIS — Z801 Family history of malignant neoplasm of trachea, bronchus and lung: Secondary | ICD-10-CM | POA: Insufficient documentation

## 2021-02-22 DIAGNOSIS — Z806 Family history of leukemia: Secondary | ICD-10-CM | POA: Insufficient documentation

## 2021-02-22 HISTORY — DX: Family history of malignant neoplasm of trachea, bronchus and lung: Z80.1

## 2021-02-22 HISTORY — DX: Family history of leukemia: Z80.6

## 2021-02-22 HISTORY — DX: Family history of malignant neoplasm of prostate: Z80.42

## 2021-02-22 HISTORY — DX: Family history of malignant neoplasm of breast: Z80.3

## 2021-02-22 HISTORY — DX: Family history of malignant neoplasm of digestive organs: Z80.0

## 2021-02-22 HISTORY — DX: Family history of malignant neoplasm of ovary: Z80.41

## 2021-02-22 NOTE — Progress Notes (Signed)
REFERRING PROVIDER: Sun, Vyvyan, MD [address] [place],  [place] [address]  PRIMARY PROVIDER:  Sun, Vyvyan, MD  PRIMARY REASON FOR VISIT:  1. Family history of breast cancer   2. FH: ovarian cancer   3. Family history of prostate cancer   4. Family history of lung cancer   5. Family history of throat cancer   6. Family history of leukemia    HISTORY OF PRESENT ILLNESS:   Mr. Ferryman, a 40 y.o. male, was seen for a [place] cancer genetics consultation at the request of Dr. Sun due to a family history of cancer.  Mr. Cork presents to clinic today to discuss the possibility of a hereditary predisposition to cancer, to discuss genetic testing, and to further clarify his future cancer risks, as well as potential cancer risks for family members.   Mr. Nazari is a 40 y.o. male with no personal history of cancer.    CANCER HISTORY:  Oncology History   No history exists.     RISK FACTORS:  Colonoscopy: no; not examined Prostate cancer screening: regularly since age 23; seeking urology consult due to recent elevated PSA levels Excessive radiation exposure: not reported   Past Medical History:  Diagnosis Date  . Asthma   . Depression   . Family history of breast cancer 02/22/2021  . Family history of leukemia 02/22/2021  . Family history of lung cancer 02/22/2021  . Family history of prostate cancer 02/22/2021  . Family history of throat cancer 02/22/2021  . FH: ovarian cancer 02/22/2021  . Herniated disc     Past Surgical History:  Procedure Laterality Date  . MOUTH SURGERY      Social History   Socioeconomic History  . Marital status: Married    Spouse name: Not on file  . Number of children: Not on file  . Years of education: Not on file  . Highest education level: Not on file  Occupational History  . Not on file  Tobacco Use  . Smoking status: Former Smoker    Packs/day: 0.50    Types: Cigarettes  . Smokeless tobacco: Not on file  Substance  and Sexual Activity  . Alcohol use: Yes  . Drug use: No  . Sexual activity: Not on file  Other Topics Concern  . Not on file  Social History Narrative  . Not on file   Social Determinants of Health   Financial Resource Strain: Not on file  Food Insecurity: Not on file  Transportation Needs: Not on file  Physical Activity: Not on file  Stress: Not on file  Social Connections: Not on file     FAMILY HISTORY:  We obtained a detailed, 4-generation family history.  Significant diagnoses are listed below: Family History  Problem Relation Age of Onset  . Prostate cancer Maternal Uncle        dx unknown age  . Breast cancer Paternal Aunt        two primaries (dx before 50, 66)  . Cancer Maternal Grandmother        unknown type  . Prostate cancer Maternal Grandfather        dx after 50  . Throat cancer Maternal Grandfather        dx after 50  . Lung cancer Paternal Grandmother   . Leukemia Paternal Grandmother        CLL  . Prostate cancer Paternal Grandfather        metastatic, d. 70s  . Ovarian   cancer Other        MGM's sister  . Colon cancer Other        MGM's sister; dx after 50  . Cancer Other        unknown type; MGM's brother  . Esophageal cancer Other        MGM's father     Mr. Remlinger has four children, three sons and one daughter.  He has five paternal half brothers, one of whom was assigned male at birth.  None of his children or siblings have a cancer history.    Mr. Heideman's mother is 61 years old without a cancer history.  Mr. Pellegrino has a maternal uncle with prostate cancer diagnosed at an unknown age, maternal grandmother with an unknown cancer, and a maternal grandfather with a history of prostate and throat cancer diagnosed after age 50   Mr. Martire's father died at age 60 and did not have cancer.  Mr. Crumpacker's paternal aunt had two breast primaries (dx before 50 and at age 66).  She is reportedly BRCA positive.  No report was available for review  today.  Mr. Farabaugh's paternal grandfather had metastatic prostate cancer and died in his 70s.  Several of Mr. Woodrick's paternal grandmother's siblings had cancer (colon, ovarian, unknown type).  Mr. Grandberry is unaware of previous family history of genetic testing for hereditary cancer risks besides that mentioned above. Patient's maternal ancestors are of [place]ian descent, and paternal ancestors are of Jamaican and Bajan descent. There is no reported Ashkenazi Jewish ancestry. There is no known consanguinity.  GENETIC COUNSELING ASSESSMENT: Mr. Katayama is a 40 y.o. male with a family history with a reported hereditary cancer syndrome. We, therefore, discussed and recommended the following at today's visit.   DISCUSSION: We discussed that 5 - 10% of cancer is hereditary, with most cases of hereditary breast cancer associated with mutations in BRCA1/2.  We briefly discussed cancer risk and management strategies for those with mutations in the BRCA genes. There are other genes that can be associated with hereditary breast, prostate, and ovarian cancer syndromes.  Type of cancer risk and level of risk are gene-specific.  We discussed that testing is beneficial for several reasons, including knowing about other cancer risks, identifying potential screening and risk-reduction options that may be appropriate, and to understanding if other family members could be at risk for cancer and allowing them to undergo genetic testing.  We reviewed the characteristics, features and inheritance patterns of hereditary cancer syndromes. We also discussed genetic testing, including the appropriate family members to test, the process of testing, insurance coverage and turn-around-time for results. We discussed the implications of a negative, positive, carrier and/or variant of uncertain significant result.  We recommended Mr. Vickers pursue genetic testing for a panel that contains genes associated with breast, ovarian,  prostate, and colon cancers.  Mr. Hardgrove was offered a common hereditary cancer panel (47 genes) and an expanded pan-cancer panel (77 genes). Mr. Carboni was informed of the benefits and limitations of each panel, including that expanded pan-cancer panels contain several genes that do not have clear management guidelines at this point in time.  We also discussed that as the number of genes included on a panel increases, the chances of variants of uncertain significance increases.  After considering the benefits and limitations of each gene panel, Mr. Ales elected to have an expanded pan-cancer panel through [facility].  The CancerNext-Expanded gene panel offered by [facility] and includes sequencing, rearrangement, and RNA analysis   for the following 77 genes: AIP, ALK, APC, ATM, AXIN2, BAP1, BARD1, BLM, BMPR1A, BRCA1, BRCA2, BRIP1, CDC73, CDH1, CDK4, CDKN1B, CDKN2A, CHEK2, CTNNA1, DICER1, FANCC, FH, FLCN, GALNT12, KIF1B, LZTR1, MAX, MEN1, MET, MLH1, MSH2, MSH3, MSH6, MUTYH, NBN, NF1, NF2, NTHL1, PALB2, PHOX2B, PMS2, POT1, PRKAR1A, PTCH1, PTEN, RAD51C, RAD51D, RB1, RECQL, RET, SDHA, SDHAF2, SDHB, SDHC, SDHD, SMAD4, SMARCA4, SMARCB1, SMARCE1, STK11, SUFU, TMEM127, TP53, TSC1, TSC2, VHL and XRCC2 (sequencing and deletion/duplication); EGFR, EGLN1, HOXB13, KIT, MITF, PDGFRA, POLD1, and POLE (sequencing only); EPCAM and GREM1 (deletion/duplication only).    Based on Mr. Ginyard family history of cancer, he meets medical criteria for genetic testing. Despite that he meets criteria, he may still have an out of pocket cost. We discussed that if his out of pocket cost for testing is over $100, the laboratory will contact him to discuss self-pay price or patient pay assistance programs.   We discussed the importance of obtaining a report from family members to ensure that any family variants are included in testing and ensure that results are able to be appropriately interpreted.   We discussed that some people  do not want to undergo genetic testing due to fear of genetic discrimination.  A federal law called the Genetic Information Non-Discrimination Act (GINA) of 2008 helps protect individuals against genetic discrimination based on their genetic test results.  It impacts both health insurance and employment.  With health insurance, it protects against increased premiums, being kicked off insurance or being forced to take a test in order to be insured.  For employment it protects against hiring, firing and promoting decisions based on genetic test results.  GINA does not apply to those in the [employer], those who work for companies with less than 15 employees, and new life insurance or long-term disability insurance policies.  Health status due to a cancer diagnosis is not protected under GINA.  PLAN: After considering the risks, benefits, and limitations, Mr. Goodlin provided informed consent to pursue genetic testing and the blood sample was sent to [facility] for analysis of the CancerNext-Expanded +RNAinsight Panel. Results should be available within approximately 3 weeks' time, at which point they will be disclosed by telephone to Mr. Guadamuz, as will any additional recommendations warranted by these results. Mr. Kamara will receive a summary of his genetic counseling visit and a copy of his results once available. This information will also be available in Epic.   Lastly, we encouraged Mr. Stults to remain in contact with cancer genetics annually so that we can continuously update the family history and inform him of any changes in cancer genetics and testing that may be of benefit for this family.   Mr. Kornegay questions were answered to his satisfaction today. Our contact information was provided should additional questions or concerns arise. Thank you for the referral and allowing [place] to share in the care of your patient.   Jaana Brodt M. Joette Catching, [place], [facility] Genetic  Counselor Zakai [email or web address] [phone]   The patient was seen for a total of 40 minutes in face-to-face genetic counseling.  Drs. Magrinat, Lindi Adie and/or Burr Medico were available to discuss this case as needed.  _______________________________________________________________________ For Office Staff:  Number of people involved in session: 1 Was an Intern/ student involved with case: no

## 2021-02-25 ENCOUNTER — Ambulatory Visit (INDEPENDENT_AMBULATORY_CARE_PROVIDER_SITE_OTHER): Payer: BC Managed Care – PPO | Admitting: Urology

## 2021-02-25 ENCOUNTER — Other Ambulatory Visit: Payer: Self-pay

## 2021-02-25 ENCOUNTER — Encounter: Payer: Self-pay | Admitting: Urology

## 2021-02-25 VITALS — BP 135/85 | HR 76 | Ht 75.0 in | Wt 225.0 lb

## 2021-02-25 DIAGNOSIS — R972 Elevated prostate specific antigen [PSA]: Secondary | ICD-10-CM

## 2021-02-25 MED ORDER — TAMSULOSIN HCL 0.4 MG PO CAPS: 0.4000 mg | ORAL_CAPSULE | Freq: Every day | ORAL | 0 refills | Status: DC

## 2021-02-25 NOTE — Progress Notes (Signed)
02/25/2021 8:45 AM   Marlinda Mike [birth year] [id number]  Referring provider: Deatra James, MD [phone] [address] [place],  [place] [address]  Chief Complaint  Patient presents with  . Elevated PSA    HPI: Matthew Saunders is a 40 y.o. male referred for evaluation of an elevated PSA.   PSA drawn 01/31/2021 was 4.31  No prior PSA results available for comparison  Occasional mild lower urinary tract symptoms  Intermittent episodes of bilateral scrotal pain  Denies dysuria, gross hematuria  States strong family history cancer and has an appointment next week with a genetic counselor  + Prostate cancer in maternal/paternal grandfathers but not in first-degree relatives   PMH: Past Medical History:  Diagnosis Date  . Asthma   . Depression   . Family history of breast cancer 02/22/2021  . Family history of leukemia 02/22/2021  . Family history of lung cancer 02/22/2021  . Family history of prostate cancer 02/22/2021  . Family history of throat cancer 02/22/2021  . FH: ovarian cancer 02/22/2021  . Herniated disc     Surgical History: Past Surgical History:  Procedure Laterality Date  . MOUTH SURGERY      Home Medications:  Allergies as of 02/25/2021      Reactions   Aspirin    Penicillins    Has patient had a PCN reaction causing immediate rash, facial/tongue/throat swelling, SOB or lightheadedness with hypotension: YES Has patient had a PCN reaction causing severe rash involving mucus membranes or skin necrosis: NO Has patient had a PCN reaction that required hospitalization NO Has patient had a PCN reaction occurring within the last 10 years: NO If all of the above answers are "NO", then may proceed with Cephalosporin use.      Medication List       Accurate as of February 25, 2021  8:45 AM. If you have any questions, ask your nurse or doctor.        albuterol 108 (90 Base) MCG/ACT inhaler Commonly known as: VENTOLIN HFA Inhale 1-2 puffs into the lungs  every 6 (six) hours as needed for wheezing or shortness of breath.   cetirizine 10 MG tablet Commonly known as: ZYRTEC Take 10 mg by mouth daily.   fluticasone 50 MCG/ACT nasal spray Commonly known as: FLONASE Place into both nostrils daily.   lidocaine 3 % Crea cream Commonly known as: LINDAMANTLE Apply 1 application topically as needed.   triamcinolone 0.1 % Commonly known as: KENALOG Apply 1 application topically 2 (two) times daily. Apply for 2 weeks. May use on face       Allergies:  Allergies  Allergen Reactions  . Aspirin   . Penicillins     Has patient had a PCN reaction causing immediate rash, facial/tongue/throat swelling, SOB or lightheadedness with hypotension: YES Has patient had a PCN reaction causing severe rash involving mucus membranes or skin necrosis: NO Has patient had a PCN reaction that required hospitalization NO Has patient had a PCN reaction occurring within the last 10 years: NO If all of the above answers are "NO", then may proceed with Cephalosporin use.    Family History: Family History  Problem Relation Age of Onset  . Prostate cancer Maternal Uncle        dx unknown age  . Breast cancer Paternal Aunt        two primaries (dx before 50, 69)  . Cancer Maternal Grandmother        unknown type  . Prostate  cancer Maternal Grandfather        dx after 50  . Throat cancer Maternal Grandfather        dx after 50  . Lung cancer Paternal Grandmother   . Leukemia Paternal Grandmother        CLL  . Prostate cancer Paternal Grandfather        metastatic, d. 40s  . Ovarian cancer Other        MGM's sister  . Colon cancer Other        MGM's sister; dx after 52  . Cancer Other        unknown type; MGM's brother  . Esophageal cancer Other        MGM's father    Social History:  reports that he has quit smoking. His smoking use included cigarettes. He smoked 0.50 packs per day. He has never used smokeless tobacco. He reports current alcohol  use. He reports that he does not use drugs.   Physical Exam: BP 135/85   Pulse 76   Ht 6\' 3"  (1.905 m)   Wt 225 lb (102.1 kg)   BMI 28.12 kg/m   Constitutional:  Alert and oriented, No acute distress. HEENT: [place] AT, moist mucus membranes.  Trachea midline, no masses. Cardiovascular: No clubbing, cyanosis, or edema. Respiratory: Normal respiratory effort, no increased work of breathing. GU: Prostate 30 g, smooth without nodules or induration Skin: No rashes, bruises or suspicious lesions. Neurologic: Grossly intact, no focal deficits, moving all 4 extremities. Psychiatric: Normal mood and affect.   Assessment & Plan:    1.  Elevated PSA  No prior PSA results for comparison; he thinks his PSA has been checked and will request copies of any available prior results  Although PSA is a prostate cancer screening test he was informed that cancer is not the most common cause of an elevated PSA. Other potential causes including BPH and inflammation were discussed.  We discussed the incidence of prostate cancer is ~ 20% for a PSA of less than 10 and a benign DRE with 4-5% having high-grade prostate cancer.    He was informed that the only way to adequately diagnose prostate cancer would be a transrectal ultrasound and biopsy of the prostate. The procedure was discussed including potential risks of bleeding and infection/sepsis. He was also informed that a negative biopsy does not conclusively rule out the possibility that prostate cancer may be present and that continued monitoring is required. The use of newer adjunctive blood tests including PHI and 4kScore were discussed. The use of multiparametric prostate MRI to evaluate for lesions suspicious for high-grade prostate cancer as well as continued periodic surveillance was also discussed.  Will initially treat with a 30-day alpha-blocker course and repeat his PSA in 1 month to make sure this is not a transient elevation.  He will think about the  above options if his PSA remains persistently elevated   , MD  [facility] [address], [place] [phone]

## 2021-02-28 DIAGNOSIS — R972 Elevated prostate specific antigen [PSA]: Secondary | ICD-10-CM | POA: Diagnosis not present

## 2021-02-28 DIAGNOSIS — Z3009 Encounter for other general counseling and advice on contraception: Secondary | ICD-10-CM | POA: Diagnosis not present

## 2021-03-08 DIAGNOSIS — S83242A Other tear of medial meniscus, current injury, left knee, initial encounter: Secondary | ICD-10-CM | POA: Diagnosis not present

## 2021-03-14 ENCOUNTER — Telehealth: Payer: Self-pay | Admitting: Genetic Counselor

## 2021-03-14 ENCOUNTER — Encounter: Payer: Self-pay | Admitting: Genetic Counselor

## 2021-03-14 ENCOUNTER — Ambulatory Visit: Payer: Self-pay | Admitting: Genetic Counselor

## 2021-03-14 DIAGNOSIS — Z803 Family history of malignant neoplasm of breast: Secondary | ICD-10-CM

## 2021-03-14 DIAGNOSIS — Z8041 Family history of malignant neoplasm of ovary: Secondary | ICD-10-CM

## 2021-03-14 DIAGNOSIS — Z8042 Family history of malignant neoplasm of prostate: Secondary | ICD-10-CM

## 2021-03-14 DIAGNOSIS — Z1379 Encounter for other screening for genetic and chromosomal anomalies: Secondary | ICD-10-CM

## 2021-03-14 DIAGNOSIS — Z806 Family history of leukemia: Secondary | ICD-10-CM

## 2021-03-14 DIAGNOSIS — Z801 Family history of malignant neoplasm of trachea, bronchus and lung: Secondary | ICD-10-CM

## 2021-03-14 DIAGNOSIS — Z800 Family history of malignant neoplasm of digestive organs: Secondary | ICD-10-CM

## 2021-03-14 NOTE — Telephone Encounter (Signed)
Contacted patient in attempt to disclose results of genetic testing.  Unable to LVM due to full mailbox.  

## 2021-03-14 NOTE — Telephone Encounter (Signed)
Revealed negative genetic testing.  Discussed that we do not know why there is cancer in the family. It could be sporadic, due to a different gene that we are not testing, or maybe our current technology may not be able to pick something up.  It will be important for him to keep in contact with genetics to keep up with whether additional testing may be needed.  Encourged him to provide aunts report to verify if true negative result.

## 2021-03-15 ENCOUNTER — Other Ambulatory Visit: Payer: Self-pay | Admitting: *Deleted

## 2021-03-15 DIAGNOSIS — R972 Elevated prostate specific antigen [PSA]: Secondary | ICD-10-CM

## 2021-03-15 DIAGNOSIS — F411 Generalized anxiety disorder: Secondary | ICD-10-CM | POA: Diagnosis not present

## 2021-03-15 NOTE — Progress Notes (Signed)
HPI:  Matthew Saunders was previously seen in the [facility] clinic due to a family history of cancer and concerns regarding a hereditary predisposition to cancer. Please refer to our prior cancer genetics clinic note for more information regarding our discussion, assessment and recommendations, at the time. Matthew Saunders recent genetic test results were disclosed to him, as were recommendations warranted by these results. These results and recommendations are discussed in more detail below.  CANCER HISTORY:  Oncology History   No history exists.    FAMILY HISTORY:  We obtained a detailed, 4-generation family history.  Significant diagnoses are listed below: Family History  Problem Relation Age of Onset  . Prostate cancer Maternal Uncle        dx unknown age  . Breast cancer Paternal Aunt        two primaries (dx before 14, 40)  . Cancer Maternal Grandmother        unknown type  . Prostate cancer Maternal Grandfather        dx after 50  . Throat cancer Maternal Grandfather        dx after 50  . Lung cancer Paternal Grandmother   . Leukemia Paternal Grandmother        CLL  . Prostate cancer Paternal Grandfather        metastatic, d. 20s  . Ovarian cancer Other        MGM's sister  . Colon cancer Other        MGM's sister; dx after 31  . Cancer Other        unknown type; MGM's brother  . Esophageal cancer Other        MGM's father    Matthew Saunders has four children, three sons and one daughter.  He has five paternal half brothers, one of whom was assigned male at birth.  None of his children or siblings have a cancer history.    Matthew Saunders mother is 18 years old without a cancer history.  Matthew Saunders has a maternal uncle with prostate cancer diagnosed at an unknown age, maternal grandmother with an unknown cancer, and a maternal grandfather with a history of prostate and throat cancer diagnosed after age 79   Matthew Saunders's father died at age 87 and did not have  cancer.  Matthew Saunders paternal aunt had two breast primaries (dx before 61 and at age 29).  She is reportedly BRCA positive.  No report was available for review today.  Matthew Saunders paternal grandfather had metastatic prostate cancer and died in his 60s.  Several of Matthew Saunders paternal grandmother's siblings had cancer (colon, ovarian, unknown type).  Matthew Saunders is unaware of previous family history of genetic testing for hereditary cancer risks besides that mentioned above. Patient's maternal ancestors are of [place] descent, and paternal ancestors are of [place] descent. There is no reported Ashkenazi Jewish ancestry. There is no known consanguinity.  GENETIC TEST RESULTS: Genetic testing reported out on March 11, 2021.  The [facility] CancerNext-Expanded +RNAinsight Panel found no pathogenic mutations. The CancerNext-Expanded gene panel offered by [facility] and includes sequencing, rearrangement, and RNA analysis for the following 77 genes: AIP, ALK, APC, ATM, AXIN2, BAP1, BARD1, BLM, BMPR1A, BRCA1, BRCA2, BRIP1, CDC73, CDH1, CDK4, CDKN1B, CDKN2A, CHEK2, CTNNA1, DICER1, FANCC, FH, FLCN, GALNT12, KIF1B, LZTR1, MAX, MEN1, MET, MLH1, MSH2, MSH3, MSH6, MUTYH, NBN, NF1, NF2, NTHL1, PALB2, PHOX2B, PMS2, POT1, PRKAR1A, PTCH1, PTEN, RAD51C, RAD51D, RB1, RECQL, RET, SDHA, SDHAF2, SDHB, SDHC, SDHD,  SMAD4, SMARCA4, SMARCB1, SMARCE1, STK11, SUFU, TMEM127, TP53, TSC1, TSC2, VHL and XRCC2 (sequencing and deletion/duplication); EGFR, EGLN1, HOXB13, KIT, MITF, PDGFRA, POLD1, and POLE (sequencing only); EPCAM and GREM1 (deletion/duplication only).   The test report has been scanned into EPIC and is located under the Molecular Pathology section of the Results Review tab.  A portion of the result report is included below for reference.     We discussed with Mr. Hobby that because current genetic testing is not perfect, it is possible there may be a gene mutation in one of these genes that current  testing cannot detect, but that chance is small.  We also discussed, that there could be another gene that has not yet been discovered, or that we have not yet tested, that is responsible for the cancer diagnoses in the family. It is also possible there is a hereditary cause for the cancer in the family that Mr. Adriano did not inherit and therefore was not identified in his testing.  Therefore, it is important to remain in touch with cancer genetics in the future so that we can continue to offer Mr. Dicaprio the most up to date genetic testing.   Mr. Groh reported that his paternal aunt had a mutation in a hereditary cancer gene.  No report was provided.  We discussed the importance in obtaining a copy of the familial report to ensure that the family mutation was included on his testing.   ADDITIONAL GENETIC TESTING: We discussed with Mr. Montesdeoca that his genetic testing was fairly extensive.  If there are genes identified to increase cancer risk that can be analyzed in the future, we would be happy to discuss and coordinate this testing at that time.    CANCER SCREENING RECOMMENDATIONS: Mr. Velazquez test result is considered negative (normal).  This means that we have not identified a mutation in a hereditary cancer gene at this time.   While reassuring, this does not definitively rule out a hereditary predisposition to cancer. It is still possible that there could be genetic mutations that are undetectable by current technology. There could be genetic mutations in genes that have not been tested or identified to increase cancer risk.  Therefore, it is recommended he continue to follow the cancer management and screening guidelines provided by his primary healthcare provider.   An individual's cancer risk and medical management are not determined by genetic test results alone. Overall cancer risk assessment incorporates additional factors, including personal medical history, family history, and any  available genetic information that may result in a personalized plan for cancer prevention and surveillance  RECOMMENDATIONS FOR FAMILY MEMBERS:  Individuals in this family might be at some increased risk of developing cancer, over the general population risk, simply due to the family history of cancer.  We recommended females in this family have a yearly mammogram beginning at age 12, or 67 years younger than the earliest onset of cancer, an annual clinical breast exam, and perform monthly breast self-exams.  Females in this family should also have a gynecological exam as recommended by their primary provider. Family members should be referred for colonoscopy starting at age 56 or earlier, as needed.  It is also possible there is a hereditary cause for the cancer in Mr. Umland family that he did not inherit and therefore was not identified in him.  Based on Mr. Hanauer family history, we recommended his paternal family members have genetic counseling and testing. Mr. Pigeon will let us know if we can  be of any assistance in coordinating genetic counseling and/or testing for these family member.   FOLLOW-UP: Lastly, we discussed with Mr. Yale that cancer genetics is a rapidly advancing field and it is possible that new genetic tests will be appropriate for him and/or his family members in the future. We encouraged him to remain in contact with cancer genetics on an annual basis so we can update his personal and family histories and let him know of advances in cancer genetics that may benefit this family.   Our contact number was provided. Mr. Hruska questions were answered to his satisfaction, and he knows he is welcome to call us at anytime with additional questions or concerns.   Gearline Spilman M. Joette Catching, [place], [facility] Genetic Counselor [email or web address] (P) [phone]

## 2021-03-29 ENCOUNTER — Other Ambulatory Visit: Payer: Self-pay

## 2021-04-07 ENCOUNTER — Other Ambulatory Visit: Payer: Self-pay | Admitting: Urology

## 2021-04-21 ENCOUNTER — Other Ambulatory Visit: Payer: BC Managed Care – PPO

## 2021-04-21 ENCOUNTER — Other Ambulatory Visit: Payer: Self-pay

## 2021-04-21 DIAGNOSIS — R972 Elevated prostate specific antigen [PSA]: Secondary | ICD-10-CM | POA: Diagnosis not present

## 2021-04-22 LAB — PSA: Prostate Specific Ag, Serum: 3.7 ng/mL (ref 0.0–4.0)

## 2021-04-27 ENCOUNTER — Encounter: Payer: Self-pay | Admitting: *Deleted

## 2021-04-27 ENCOUNTER — Telehealth: Payer: Self-pay | Admitting: *Deleted

## 2021-04-27 DIAGNOSIS — R972 Elevated prostate specific antigen [PSA]: Secondary | ICD-10-CM

## 2021-04-27 NOTE — Telephone Encounter (Signed)
-----   Message from Riki Altes, MD sent at 04/25/2021 10:38 AM EDT ----- PSA level improved at 3.7 however it is still elevated for his age group.  Recommend scheduling either a 4K score blood drawl or prostate MRI depending on his preference

## 2021-05-02 NOTE — Telephone Encounter (Signed)
Patient would like to do the prostate MRI.

## 2021-05-03 NOTE — Addendum Note (Signed)
Addended by: Irineo Axon C on: 05/03/2021 09:30 AM   Modules accepted: Orders

## 2021-05-03 NOTE — Telephone Encounter (Signed)
MRI order entered.

## 2021-06-06 ENCOUNTER — Other Ambulatory Visit: Payer: Self-pay

## 2021-06-06 ENCOUNTER — Ambulatory Visit
Admission: RE | Admit: 2021-06-06 | Discharge: 2021-06-06 | Disposition: A | Discharge status: Home / Self Care | Payer: BC Managed Care – PPO | Source: Ambulatory Visit | Attending: Urology | Admitting: Urology

## 2021-06-06 DIAGNOSIS — R972 Elevated prostate specific antigen [PSA]: Secondary | ICD-10-CM | POA: Insufficient documentation

## 2021-06-06 DIAGNOSIS — R590 Localized enlarged lymph nodes: Secondary | ICD-10-CM | POA: Diagnosis not present

## 2021-06-06 MED ORDER — GADOBUTROL 1 MMOL/ML IV SOLN
10.0000 mL | Freq: Once | INTRAVENOUS | Status: AC | PRN
  Administered 2021-06-06: 10 mL via INTRAVENOUS

## 2021-06-07 ENCOUNTER — Encounter: Payer: Self-pay | Admitting: Urology

## 2021-06-07 DIAGNOSIS — F411 Generalized anxiety disorder: Secondary | ICD-10-CM | POA: Diagnosis not present

## 2021-06-08 ENCOUNTER — Encounter (INDEPENDENT_AMBULATORY_CARE_PROVIDER_SITE_OTHER): Payer: Self-pay

## 2021-06-10 ENCOUNTER — Other Ambulatory Visit: Payer: Self-pay | Admitting: Urology

## 2021-06-10 DIAGNOSIS — R972 Elevated prostate specific antigen [PSA]: Secondary | ICD-10-CM

## 2021-07-18 DIAGNOSIS — R972 Elevated prostate specific antigen [PSA]: Secondary | ICD-10-CM | POA: Diagnosis not present

## 2021-07-19 DIAGNOSIS — H5213 Myopia, bilateral: Secondary | ICD-10-CM | POA: Diagnosis not present

## 2021-07-21 ENCOUNTER — Other Ambulatory Visit: Payer: Self-pay | Admitting: Urology

## 2021-07-28 ENCOUNTER — Telehealth (INDEPENDENT_AMBULATORY_CARE_PROVIDER_SITE_OTHER): Payer: BC Managed Care – PPO | Admitting: Urology

## 2021-07-28 ENCOUNTER — Other Ambulatory Visit: Payer: Self-pay

## 2021-07-28 ENCOUNTER — Encounter: Payer: Self-pay | Admitting: Urology

## 2021-07-28 DIAGNOSIS — R972 Elevated prostate specific antigen [PSA]: Secondary | ICD-10-CM | POA: Diagnosis not present

## 2021-07-28 NOTE — Progress Notes (Signed)
Virtual Visit via Telephone Note  I connected with Matthew Saunders on 07/28/21 at  8:00 AM EDT by telephone and verified that I am speaking with the correct person using two identifiers.  Location: Patient: Home Provider: [facility] Participants: Patient and provider    I discussed the limitations, risks, security and privacy concerns of performing an evaluation and management service by telephone and the availability of in person appointments. I also discussed with the patient that there may be a patient responsible charge related to this service. The patient expressed understanding and agreed to proceed.   History of Present Illness: I contacted Matthew Saunders regarding his prostate biopsy results.  PSA 4.24 February 2021 and prostate MRI showing a PI-RADS 3 lesion left PZ with a prostate volume 23 g  MR fusion biopsy performed At [facility] 07/18/2021.  He had no postbiopsy complaints  2 ROI biopsies were taken both showing benign prostate tissue  12 core template biopsies remarkable for a small focus of atypical glands left lateral base   Observations/Objective: Alert, answers questions appropriately  Assessment and Plan: We discussed the pathologic finding of atypia which could represent a precancerous finding or pathologic changes in an area adjacent to cancer The need for close monitoring and possible rebiopsy was discussed  Follow Up Instructions: 27-month follow-up with PSA Rebiopsy versus repeat prostate MRI if PSA increasing on follow-up   I discussed the assessment and treatment plan with the patient. The patient was provided an opportunity to ask questions and all were answered. The patient agreed with the plan and demonstrated an understanding of the instructions.   The patient was advised to call back or seek an in-person evaluation if the symptoms worsen or if the condition fails to improve as anticipated.  I provided 12 minutes of  non-face-to-face time during this encounter.   Riki Altes, MD

## 2022-01-26 ENCOUNTER — Other Ambulatory Visit: Payer: BC Managed Care – PPO

## 2022-01-27 ENCOUNTER — Ambulatory Visit: Payer: BC Managed Care – PPO | Admitting: Urology

## 2022-02-20 DIAGNOSIS — R030 Elevated blood-pressure reading, without diagnosis of hypertension: Secondary | ICD-10-CM | POA: Diagnosis not present

## 2022-02-20 DIAGNOSIS — G5602 Carpal tunnel syndrome, left upper limb: Secondary | ICD-10-CM | POA: Diagnosis not present

## 2022-02-20 DIAGNOSIS — M4722 Other spondylosis with radiculopathy, cervical region: Secondary | ICD-10-CM | POA: Diagnosis not present

## 2022-02-20 DIAGNOSIS — Z6827 Body mass index (BMI) 27.0-27.9, adult: Secondary | ICD-10-CM | POA: Diagnosis not present

## 2022-02-21 ENCOUNTER — Other Ambulatory Visit: Payer: Self-pay

## 2022-02-21 DIAGNOSIS — R972 Elevated prostate specific antigen [PSA]: Secondary | ICD-10-CM

## 2022-02-23 ENCOUNTER — Other Ambulatory Visit: Payer: Self-pay | Admitting: Neurosurgery

## 2022-02-23 DIAGNOSIS — M4722 Other spondylosis with radiculopathy, cervical region: Secondary | ICD-10-CM

## 2022-02-28 ENCOUNTER — Other Ambulatory Visit: Payer: BC Managed Care – PPO

## 2022-03-01 ENCOUNTER — Ambulatory Visit: Payer: BC Managed Care – PPO | Admitting: Urology

## 2022-03-04 ENCOUNTER — Ambulatory Visit
Admission: RE | Admit: 2022-03-04 | Discharge: 2022-03-04 | Disposition: A | Discharge status: Home / Self Care | Payer: BC Managed Care – PPO | Source: Ambulatory Visit | Attending: Neurosurgery | Admitting: Neurosurgery

## 2022-03-04 DIAGNOSIS — M4722 Other spondylosis with radiculopathy, cervical region: Secondary | ICD-10-CM

## 2022-03-04 DIAGNOSIS — R200 Anesthesia of skin: Secondary | ICD-10-CM | POA: Diagnosis not present

## 2022-03-16 DIAGNOSIS — M4722 Other spondylosis with radiculopathy, cervical region: Secondary | ICD-10-CM | POA: Diagnosis not present

## 2022-03-27 ENCOUNTER — Other Ambulatory Visit: Payer: BC Managed Care – PPO

## 2022-03-27 DIAGNOSIS — R972 Elevated prostate specific antigen [PSA]: Secondary | ICD-10-CM

## 2022-03-28 LAB — PSA: Prostate Specific Ag, Serum: 1 ng/mL (ref 0.0–4.0)

## 2022-03-31 ENCOUNTER — Ambulatory Visit: Payer: BC Managed Care – PPO | Admitting: Urology

## 2022-03-31 ENCOUNTER — Encounter: Payer: Self-pay | Admitting: Urology

## 2022-04-07 DIAGNOSIS — H532 Diplopia: Secondary | ICD-10-CM | POA: Diagnosis not present

## 2022-04-07 DIAGNOSIS — Z566 Other physical and mental strain related to work: Secondary | ICD-10-CM | POA: Diagnosis not present

## 2022-04-11 DIAGNOSIS — F411 Generalized anxiety disorder: Secondary | ICD-10-CM | POA: Diagnosis not present

## 2022-04-26 ENCOUNTER — Ambulatory Visit (INDEPENDENT_AMBULATORY_CARE_PROVIDER_SITE_OTHER): Payer: BC Managed Care – PPO | Admitting: Urology

## 2022-04-26 ENCOUNTER — Encounter: Payer: Self-pay | Admitting: Urology

## 2022-04-26 VITALS — BP 149/82 | HR 60 | Ht 75.0 in | Wt 220.0 lb

## 2022-04-26 DIAGNOSIS — N4232 Atypical small acinar proliferation of prostate: Secondary | ICD-10-CM | POA: Diagnosis not present

## 2022-04-26 DIAGNOSIS — R972 Elevated prostate specific antigen [PSA]: Secondary | ICD-10-CM

## 2022-04-26 NOTE — Progress Notes (Signed)
04/26/2022 9:06 AM   Catalina Gravel 05-16-1981 [id number]  Referring provider: Donald Prose, MD [place] [place],  [place] [address]  Chief Complaint  Patient presents with   Elevated PSA    Urologic history: 1.  Elevated PSA PSA 4.24 February 2021; prostate MRI PI-RADS 3 lesion left PZ; vol 23g MR fusion biopsy 06/2021; ROI biopsies benign; 1/12 core focus atypia  HPI: 41 y.o. male presents for 77-month follow-up.  Doing well since last visit No bothersome LUTS Denies dysuria, gross hematuria Denies flank, abdominal or pelvic pain PSA 03/27/2022 1.0   PMH: Past Medical History:  Diagnosis Date   Asthma    Depression    Family history of breast cancer 02/22/2021   Family history of leukemia 02/22/2021   Family history of lung cancer 02/22/2021   Family history of prostate cancer 02/22/2021   Family history of throat cancer 02/22/2021   FH: ovarian cancer 02/22/2021   Herniated disc     Surgical History: Past Surgical History:  Procedure Laterality Date   MOUTH SURGERY      Home Medications:  Allergies as of 04/26/2022       Reactions   Aspirin    Penicillins    Has patient had a PCN reaction causing immediate rash, facial/tongue/throat swelling, SOB or lightheadedness with hypotension: YES Has patient had a PCN reaction causing severe rash involving mucus membranes or skin necrosis: NO Has patient had a PCN reaction that required hospitalization NO Has patient had a PCN reaction occurring within the last 10 years: NO If all of the above answers are "NO", then may proceed with Cephalosporin use.        Medication List        Accurate as of Apr 26, 2022  9:06 AM. If you have any questions, ask your nurse or doctor.          albuterol 108 (90 Base) MCG/ACT inhaler Commonly known as: VENTOLIN HFA Inhale 1-2 puffs into the lungs every 6 (six) hours as needed for wheezing or shortness of breath.   cetirizine 10 MG tablet Commonly known  as: ZYRTEC Take 10 mg by mouth daily.   fluticasone 50 MCG/ACT nasal spray Commonly known as: FLONASE Place into both nostrils daily.   lidocaine 3 % Crea cream Commonly known as: LINDAMANTLE Apply 1 application topically as needed.   tamsulosin 0.4 MG Caps capsule Commonly known as: FLOMAX Take 1 capsule (0.4 mg total) by mouth daily.   triamcinolone cream 0.1 % Commonly known as: KENALOG Apply 1 application topically 2 (two) times daily. Apply for 2 weeks. May use on face        Allergies:  Allergies  Allergen Reactions   Aspirin    Penicillins     Has patient had a PCN reaction causing immediate rash, facial/tongue/throat swelling, SOB or lightheadedness with hypotension: YES Has patient had a PCN reaction causing severe rash involving mucus membranes or skin necrosis: NO Has patient had a PCN reaction that required hospitalization NO Has patient had a PCN reaction occurring within the last 10 years: NO If all of the above answers are "NO", then may proceed with Cephalosporin use.    Family History: Family History  Problem Relation Age of Onset   Prostate cancer Maternal Uncle        dx unknown age   Breast cancer Paternal Aunt        two primaries (dx before 64, 40)   Cancer Maternal Grandmother  unknown type   Prostate cancer Maternal Grandfather        dx after 50   Throat cancer Maternal Grandfather        dx after 70   Lung cancer Paternal Grandmother    Leukemia Paternal Grandmother        CLL   Prostate cancer Paternal Grandfather        metastatic, d. 2s   Ovarian cancer Other        MGM's sister   Colon cancer Other        MGM's sister; dx after 37   Cancer Other        unknown type; MGM's brother   Esophageal cancer Other        MGM's father    Social History:  reports that he has quit smoking. His smoking use included cigarettes. He smoked an average of .5 packs per day. He has never used smokeless tobacco. He reports current alcohol  use. He reports that he does not use drugs.   Physical Exam: BP (!) 149/82   Pulse 60   Ht 6\' 3"  (1.905 m)   Wt 220 lb (99.8 kg)   BMI 27.50 kg/m   Constitutional:  Alert and oriented, No acute distress. Psychiatric: Normal mood and affect.   Assessment & Plan:    1.  Atypical small acinar proliferation PSA much lower at 1.0 Follow-up office visit 6 months Campbell, MD  [facility] [address] [place], [place] [address] [phone]

## 2022-05-09 DIAGNOSIS — F411 Generalized anxiety disorder: Secondary | ICD-10-CM | POA: Diagnosis not present

## 2022-05-26 DIAGNOSIS — F1290 Cannabis use, unspecified, uncomplicated: Secondary | ICD-10-CM | POA: Diagnosis not present

## 2022-05-26 DIAGNOSIS — Z87898 Personal history of other specified conditions: Secondary | ICD-10-CM | POA: Diagnosis not present

## 2022-05-26 DIAGNOSIS — Z0000 Encounter for general adult medical examination without abnormal findings: Secondary | ICD-10-CM | POA: Diagnosis not present

## 2022-05-26 DIAGNOSIS — R41840 Attention and concentration deficit: Secondary | ICD-10-CM | POA: Diagnosis not present

## 2022-05-26 DIAGNOSIS — Z13220 Encounter for screening for lipoid disorders: Secondary | ICD-10-CM | POA: Diagnosis not present

## 2022-05-26 DIAGNOSIS — F330 Major depressive disorder, recurrent, mild: Secondary | ICD-10-CM | POA: Diagnosis not present

## 2022-06-22 DIAGNOSIS — F411 Generalized anxiety disorder: Secondary | ICD-10-CM | POA: Diagnosis not present

## 2022-07-10 DIAGNOSIS — Z302 Encounter for sterilization: Secondary | ICD-10-CM | POA: Diagnosis not present

## 2022-07-11 DIAGNOSIS — F411 Generalized anxiety disorder: Secondary | ICD-10-CM | POA: Diagnosis not present

## 2022-08-01 DIAGNOSIS — F411 Generalized anxiety disorder: Secondary | ICD-10-CM | POA: Diagnosis not present

## 2022-08-21 DIAGNOSIS — F411 Generalized anxiety disorder: Secondary | ICD-10-CM | POA: Diagnosis not present

## 2022-10-26 ENCOUNTER — Other Ambulatory Visit: Payer: BC Managed Care – PPO

## 2022-10-27 ENCOUNTER — Encounter: Payer: Self-pay | Admitting: Urology

## 2022-10-27 ENCOUNTER — Ambulatory Visit: Payer: BC Managed Care – PPO | Admitting: Urology

## 2022-10-27 DIAGNOSIS — Z419 Encounter for procedure for purposes other than remedying health state, unspecified: Secondary | ICD-10-CM | POA: Diagnosis not present

## 2022-11-01 ENCOUNTER — Other Ambulatory Visit: Payer: BC Managed Care – PPO

## 2022-11-01 DIAGNOSIS — R972 Elevated prostate specific antigen [PSA]: Secondary | ICD-10-CM

## 2022-11-01 DIAGNOSIS — R41840 Attention and concentration deficit: Secondary | ICD-10-CM | POA: Diagnosis not present

## 2022-11-02 LAB — PSA: Prostate Specific Ag, Serum: 2.1 ng/mL (ref 0.0–4.0)

## 2022-11-03 DIAGNOSIS — R41840 Attention and concentration deficit: Secondary | ICD-10-CM | POA: Diagnosis not present

## 2022-11-03 DIAGNOSIS — Z79899 Other long term (current) drug therapy: Secondary | ICD-10-CM | POA: Diagnosis not present

## 2022-11-06 ENCOUNTER — Ambulatory Visit: Payer: BC Managed Care – PPO | Admitting: Urology

## 2022-11-06 DIAGNOSIS — R41840 Attention and concentration deficit: Secondary | ICD-10-CM | POA: Diagnosis not present

## 2022-11-07 DIAGNOSIS — F988 Other specified behavioral and emotional disorders with onset usually occurring in childhood and adolescence: Secondary | ICD-10-CM | POA: Diagnosis not present

## 2022-11-07 DIAGNOSIS — F1290 Cannabis use, unspecified, uncomplicated: Secondary | ICD-10-CM | POA: Diagnosis not present

## 2022-11-07 DIAGNOSIS — F17210 Nicotine dependence, cigarettes, uncomplicated: Secondary | ICD-10-CM | POA: Diagnosis not present

## 2022-11-07 DIAGNOSIS — E782 Mixed hyperlipidemia: Secondary | ICD-10-CM | POA: Diagnosis not present

## 2022-11-09 DIAGNOSIS — F411 Generalized anxiety disorder: Secondary | ICD-10-CM | POA: Diagnosis not present

## 2022-11-27 DIAGNOSIS — Z419 Encounter for procedure for purposes other than remedying health state, unspecified: Secondary | ICD-10-CM | POA: Diagnosis not present

## 2022-12-01 ENCOUNTER — Ambulatory Visit (INDEPENDENT_AMBULATORY_CARE_PROVIDER_SITE_OTHER): Payer: BC Managed Care – PPO | Admitting: Urology

## 2022-12-01 ENCOUNTER — Encounter: Payer: Self-pay | Admitting: Urology

## 2022-12-01 VITALS — BP 158/78 | HR 93 | Ht 75.0 in | Wt 222.0 lb

## 2022-12-01 DIAGNOSIS — N4232 Atypical small acinar proliferation of prostate: Secondary | ICD-10-CM | POA: Diagnosis not present

## 2022-12-01 NOTE — Progress Notes (Signed)
12/01/2022 10:21 AM   Matthew Saunders 1981-08-08 [id number]  Referring provider: Donald Prose, MD Matthew Saunders,   [id number]  Chief Complaint  Patient presents with   Elevated PSA    Urologic history: 1.  Elevated PSA PSA 4.24 February 2021; prostate MRI PI-RADS 3 lesion left PZ; vol 23g MR fusion biopsy 06/2021; ROI biopsies benign; 1/12 core focus atypia  HPI: 42 y.o. male presents for 6 month follow-up.  Doing well since last visit No bothersome LUTS Denies dysuria, gross hematuria Denies flank, abdominal or pelvic pain PSA 11/01/2022: 2.1   PMH: Past Medical History:  Diagnosis Date   Asthma    Depression    Family history of breast cancer 02/22/2021   Family history of leukemia 02/22/2021   Family history of lung cancer 02/22/2021   Family history of prostate cancer 02/22/2021   Family history of throat cancer 02/22/2021   FH: ovarian cancer 02/22/2021   Herniated disc     Surgical History: Past Surgical History:  Procedure Laterality Date   MOUTH SURGERY      Home Medications:  Allergies as of 12/01/2022       Reactions   Aspirin    Penicillins    Has patient had a PCN reaction causing immediate rash, facial/tongue/throat swelling, SOB or lightheadedness with hypotension: YES Has patient had a PCN reaction causing severe rash involving mucus membranes or skin necrosis: NO Has patient had a PCN reaction that required hospitalization NO Has patient had a PCN reaction occurring within the last 10 years: NO If all of the above answers are "NO", then may proceed with Cephalosporin use.        Medication List        Accurate as of December 01, 2022 10:21 AM. If you have any questions, ask your nurse or doctor.          albuterol 108 (90 Base) MCG/ACT inhaler Commonly known as: VENTOLIN HFA Inhale 1-2 puffs into the lungs every 6 (six) hours as needed for wheezing or shortness of breath.   atorvastatin 40 MG tablet Commonly  known as: LIPITOR SMARTSIG:1 Tablet(s) By Mouth Every Evening   cetirizine 10 MG tablet Commonly known as: ZYRTEC Take 10 mg by mouth daily.   fluticasone 50 MCG/ACT nasal spray Commonly known as: FLONASE Place into both nostrils daily.   lidocaine 3 % Crea cream Commonly known as: LINDAMANTLE Apply 1 application topically as needed.   tamsulosin 0.4 MG Caps capsule Commonly known as: FLOMAX Take 1 capsule (0.4 mg total) by mouth daily.   triamcinolone cream 0.1 % Commonly known as: KENALOG Apply 1 application topically 2 (two) times daily. Apply for 2 weeks. May use on face        Allergies:  Allergies  Allergen Reactions   Aspirin    Penicillins     Has patient had a PCN reaction causing immediate rash, facial/tongue/throat swelling, SOB or lightheadedness with hypotension: YES Has patient had a PCN reaction causing severe rash involving mucus membranes or skin necrosis: NO Has patient had a PCN reaction that required hospitalization NO Has patient had a PCN reaction occurring within the last 10 years: NO If all of the above answers are "NO", then may proceed with Cephalosporin use.    Family History: Family History  Problem Relation Age of Onset   Prostate cancer Maternal Uncle        dx unknown age   Breast cancer Paternal 40  two primaries (dx before 50, 57)   Cancer Maternal Grandmother        unknown type   Prostate cancer Maternal Grandfather        dx after 50   Throat cancer Maternal Grandfather        dx after 35   Lung cancer Paternal Grandmother    Leukemia Paternal Grandmother        CLL   Prostate cancer Paternal Grandfather        metastatic, d. 50s   Ovarian cancer Other        MGM's sister   Colon cancer Other        MGM's sister; dx after 42   Cancer Other        unknown type; MGM's brother   Esophageal cancer Other        MGM's father    Social History:  reports that he has quit smoking. His smoking use included  cigarettes. He smoked an average of .5 packs per day. He has never used smokeless tobacco. He reports current alcohol use. He reports that he does not use drugs.   Physical Exam: BP (!) 158/78   Pulse 93   Ht 6\' 3"  (1.905 m)   Wt 222 lb (100.7 kg)   BMI 27.75 kg/m   Constitutional:  Alert and oriented, No acute distress. Psychiatric: Normal mood and affect.   Assessment & Plan:    1.  Atypical small acinar proliferation Stable/normal PSA Follow-up 1 year PSA/DRE   Abbie Sons, MD  [facility] [address], [place], [place] [address] [phone]

## 2022-12-04 DIAGNOSIS — F902 Attention-deficit hyperactivity disorder, combined type: Secondary | ICD-10-CM | POA: Diagnosis not present

## 2022-12-04 DIAGNOSIS — Z79899 Other long term (current) drug therapy: Secondary | ICD-10-CM | POA: Diagnosis not present

## 2022-12-05 DIAGNOSIS — F902 Attention-deficit hyperactivity disorder, combined type: Secondary | ICD-10-CM | POA: Diagnosis not present

## 2022-12-05 DIAGNOSIS — Z79899 Other long term (current) drug therapy: Secondary | ICD-10-CM | POA: Diagnosis not present

## 2022-12-28 DIAGNOSIS — E782 Mixed hyperlipidemia: Secondary | ICD-10-CM | POA: Diagnosis not present

## 2023-01-15 DIAGNOSIS — Z79899 Other long term (current) drug therapy: Secondary | ICD-10-CM | POA: Diagnosis not present

## 2023-01-15 DIAGNOSIS — Z79891 Long term (current) use of opiate analgesic: Secondary | ICD-10-CM | POA: Diagnosis not present

## 2023-01-15 DIAGNOSIS — F902 Attention-deficit hyperactivity disorder, combined type: Secondary | ICD-10-CM | POA: Diagnosis not present

## 2023-01-15 DIAGNOSIS — Z5181 Encounter for therapeutic drug level monitoring: Secondary | ICD-10-CM | POA: Diagnosis not present

## 2023-02-06 DIAGNOSIS — F1290 Cannabis use, unspecified, uncomplicated: Secondary | ICD-10-CM | POA: Diagnosis not present

## 2023-02-06 DIAGNOSIS — J019 Acute sinusitis, unspecified: Secondary | ICD-10-CM | POA: Diagnosis not present

## 2023-02-06 DIAGNOSIS — F17210 Nicotine dependence, cigarettes, uncomplicated: Secondary | ICD-10-CM | POA: Diagnosis not present

## 2023-02-06 DIAGNOSIS — E782 Mixed hyperlipidemia: Secondary | ICD-10-CM | POA: Diagnosis not present

## 2023-03-12 DIAGNOSIS — Z5181 Encounter for therapeutic drug level monitoring: Secondary | ICD-10-CM | POA: Diagnosis not present

## 2023-03-12 DIAGNOSIS — Z79891 Long term (current) use of opiate analgesic: Secondary | ICD-10-CM | POA: Diagnosis not present

## 2023-03-12 DIAGNOSIS — F902 Attention-deficit hyperactivity disorder, combined type: Secondary | ICD-10-CM | POA: Diagnosis not present

## 2023-03-12 DIAGNOSIS — Z79899 Other long term (current) drug therapy: Secondary | ICD-10-CM | POA: Diagnosis not present

## 2023-03-28 DIAGNOSIS — H5213 Myopia, bilateral: Secondary | ICD-10-CM | POA: Diagnosis not present

## 2023-04-27 IMAGING — MR MR CERVICAL SPINE W/O CM
5 of 6 series · 33 of 48 positions shown · non-contrast
Comparison: 05/26/2010

CLINICAL DATA: Left-sided neck and arm pain with numbness extending
to the left thumb for 11 years

EXAM:
MRI CERVICAL SPINE WITHOUT CONTRAST
TECHNIQUE: Multiplanar, multisequence MR imaging of the cervical spine was
performed. No intravenous contrast was administered.

[Series 6: T2 · sagittal · 3.0mm · 0.66mm/px · 5 of 15 slices shown (1 of 3)]
[im 1/15]
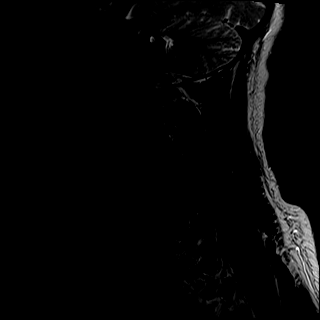
[im 4/15]
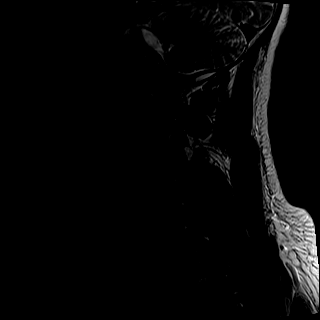
[im 8/15]
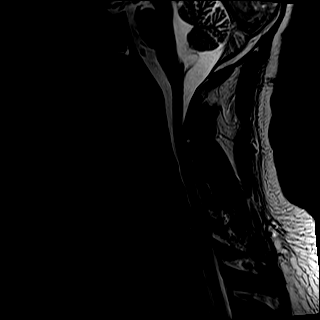
[im 11/15]
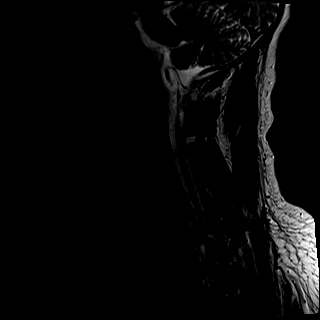
[im 15/15]
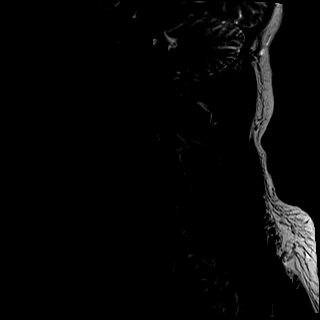

[Series 7: T1 · sagittal · 3.0mm · 0.41mm/px · 6 of 15 slices shown]
[im 1/15]
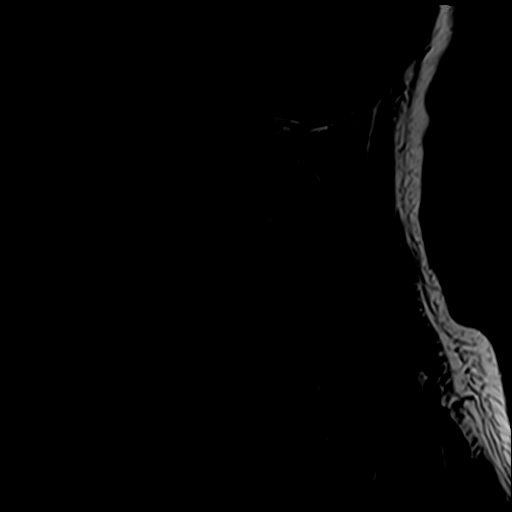
[im 3/15]
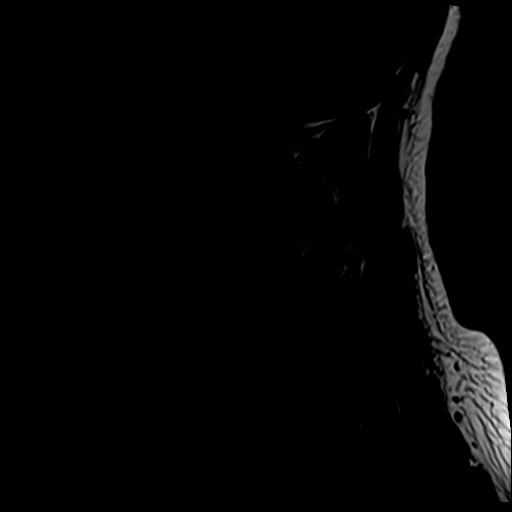
[im 6/15]
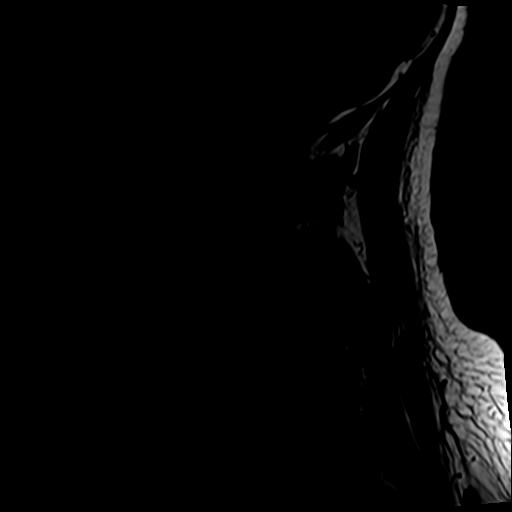
[im 9/15]
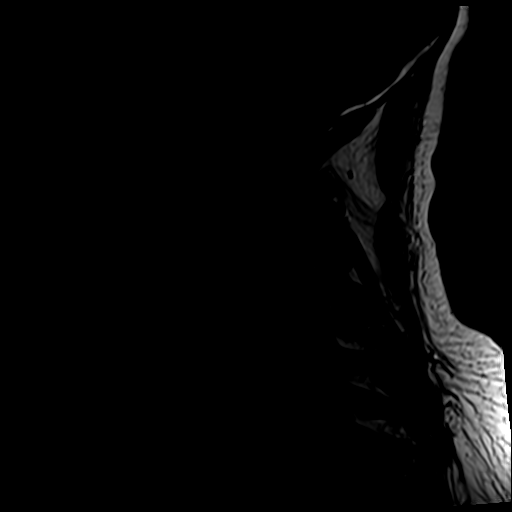
[im 12/15]
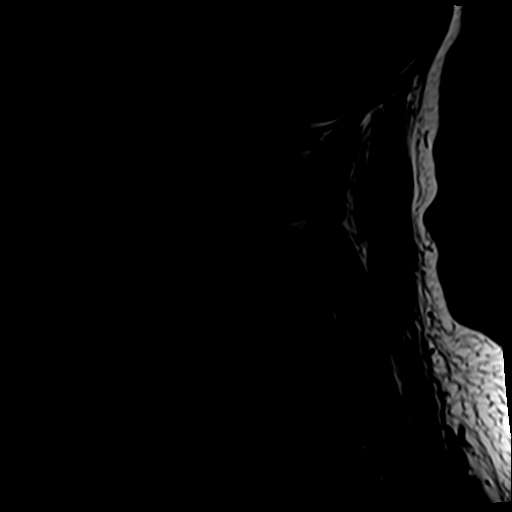
[im 15/15]
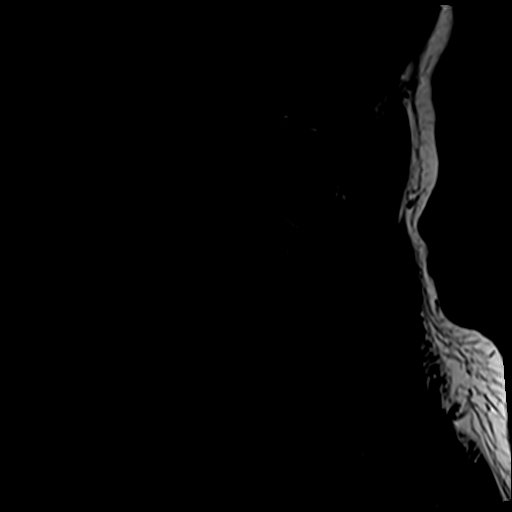

[Series 8: tir sag · sagittal · 3.0mm · 0.41mm/px · 6 of 15 slices shown]
[im 1/15]
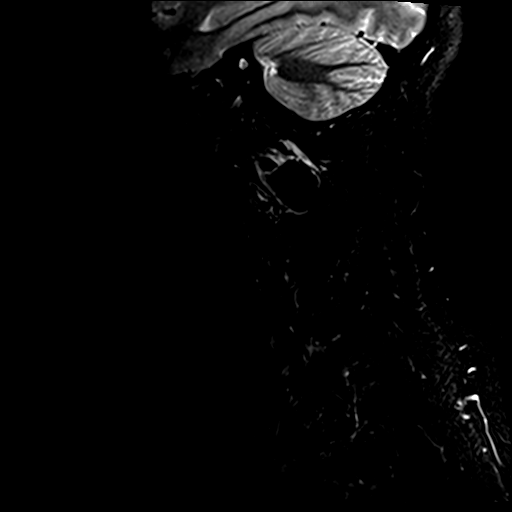
[im 3/15]
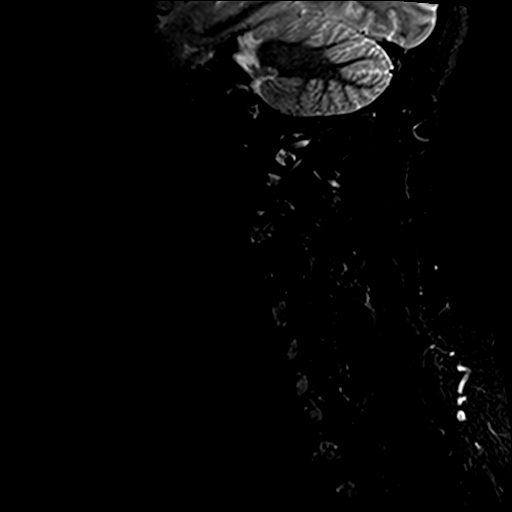
[im 6/15]
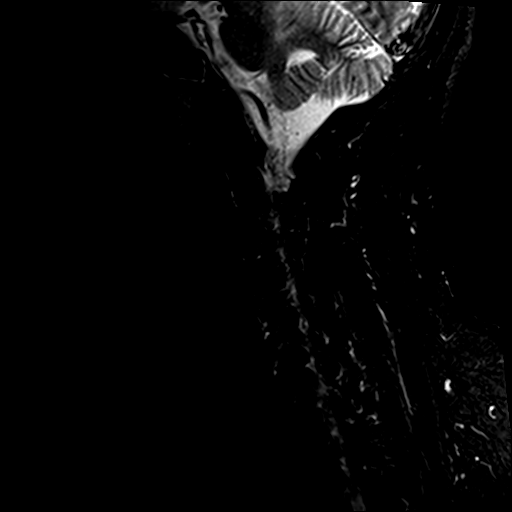
[im 9/15]
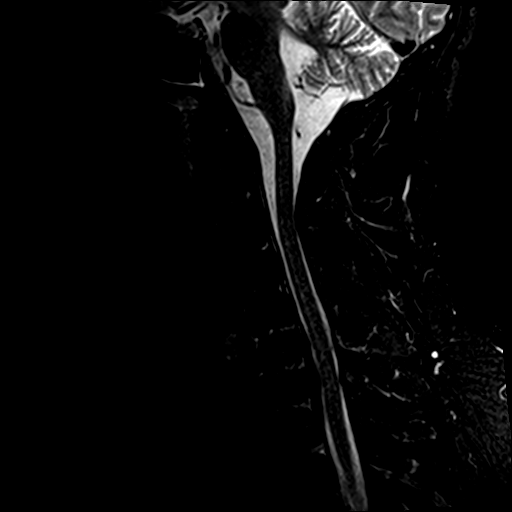
[im 12/15]
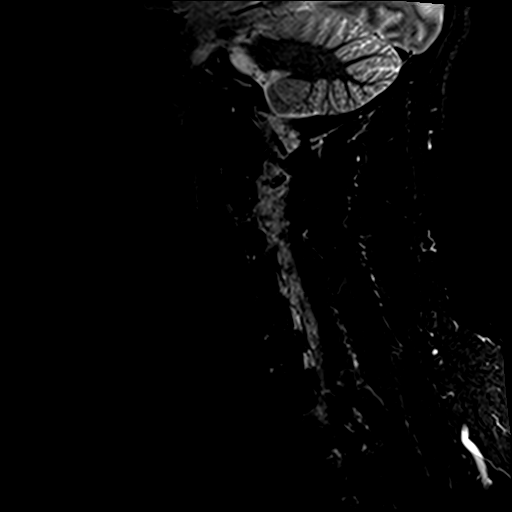
[im 15/15]
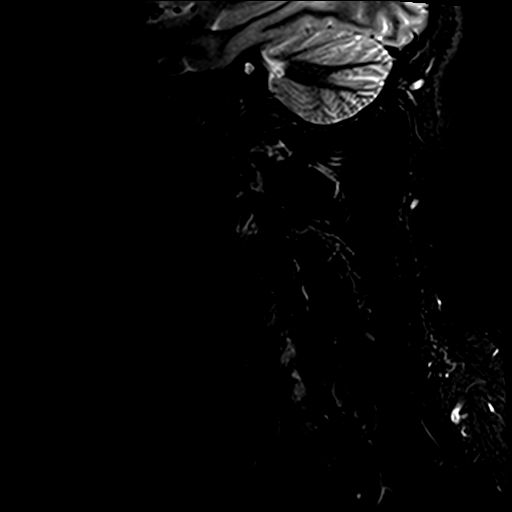

[Series 10: T2 · axial · 3.0mm · 0.70mm/px · z∈[-56,+52]mm · 8 of 28 slices shown (2 of 3)]
[im 1/28]
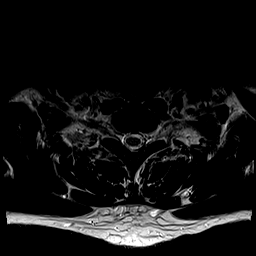
[im 4/28]
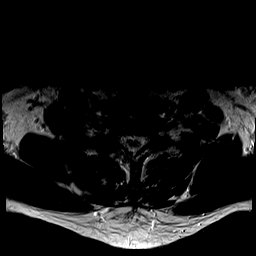
[im 10/28]
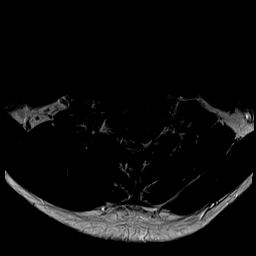
[im 13/28]
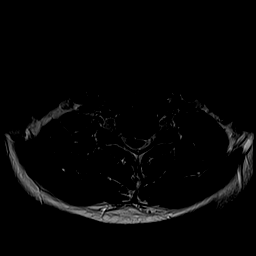
[im 16/28]
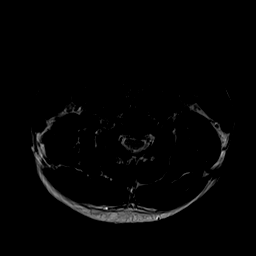
[im 19/28]
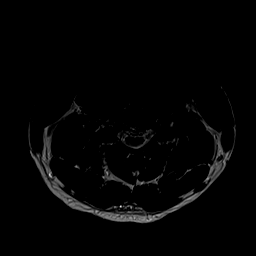
[im 25/28]
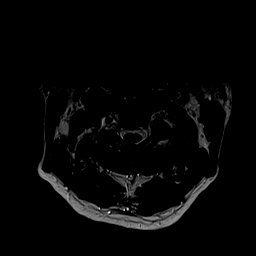
[im 28/28]
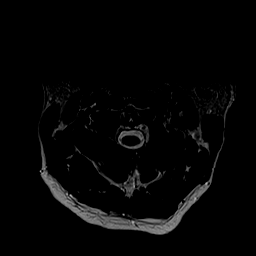

[Series 11: T2 · axial · 3.0mm · 0.70mm/px · z∈[-56,+52]mm · 8 of 26 slices shown (3 of 3)]
[im 1/26]
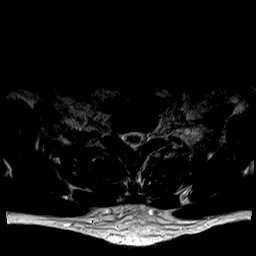
[im 3/26]
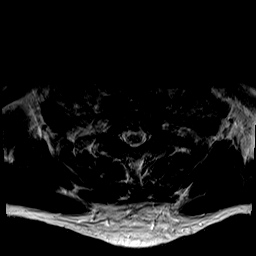
[im 9/26]
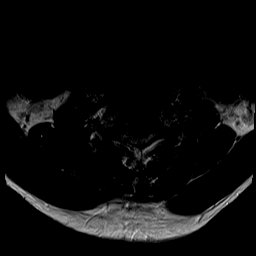
[im 12/26]
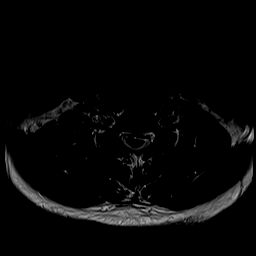
[im 14/26]
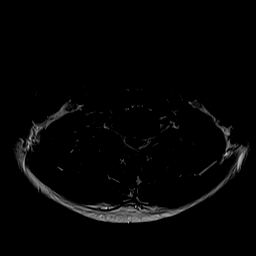
[im 17/26]
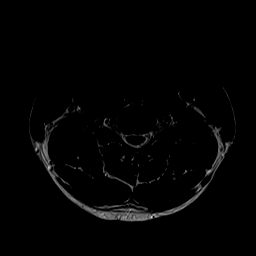
[im 23/26]
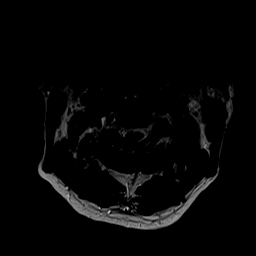
[im 26/26]
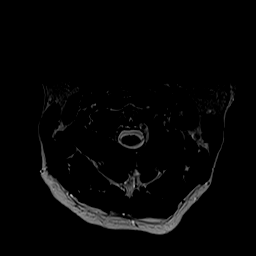

[33 of 48 positions shown; findings below may reference images not displayed]

FINDINGS: Alignment: Reversal of cervical lordosis

Vertebrae: No fracture, evidence of discitis, or bone lesion.

Cord: Normal signal and morphology.

Posterior Fossa, vertebral arteries, paraspinal tissues: Negative
for perispinal mass or inflammation

Disc levels:

C2-3: Unremarkable.

C3-4: Minor annulus bulging at the foramina.

C4-5: Minor annulus bulging

C5-6: Disc space narrowing with left paracentral protrusion that is
chronic when compared to prior. The herniation appears less
protuberant than before. Bilateral uncovertebral ridging and disc
bulging with mild bilateral foraminal narrowing

C6-7: Disc narrowing with left paracentral protrusion indenting the
left ventral cord. Biforaminal protrusion and uncovertebral spurring
with left foraminal impingement. Right foraminal narrowing appears
mild.

C7-T1:Unremarkable.

Intermittent motion artifact.
IMPRESSION: 1. C6-7 left paracentral protrusion with left ventral cord
flattening. Left foraminal impingement at the same level.
2. C5-6 chronic left paracentral protrusion which has mildly
regressed from 6188 with mild indentation of the left ventral cord.
Mild bilateral foraminal narrowing at this level.

## 2023-05-10 DIAGNOSIS — Z79899 Other long term (current) drug therapy: Secondary | ICD-10-CM | POA: Diagnosis not present

## 2023-05-10 DIAGNOSIS — F902 Attention-deficit hyperactivity disorder, combined type: Secondary | ICD-10-CM | POA: Diagnosis not present

## 2023-06-04 DIAGNOSIS — E782 Mixed hyperlipidemia: Secondary | ICD-10-CM | POA: Diagnosis not present

## 2023-06-04 DIAGNOSIS — Z23 Encounter for immunization: Secondary | ICD-10-CM | POA: Diagnosis not present

## 2023-06-04 DIAGNOSIS — F1290 Cannabis use, unspecified, uncomplicated: Secondary | ICD-10-CM | POA: Diagnosis not present

## 2023-06-04 DIAGNOSIS — Z0000 Encounter for general adult medical examination without abnormal findings: Secondary | ICD-10-CM | POA: Diagnosis not present

## 2023-06-04 DIAGNOSIS — J45909 Unspecified asthma, uncomplicated: Secondary | ICD-10-CM | POA: Diagnosis not present

## 2023-06-04 DIAGNOSIS — F5104 Psychophysiologic insomnia: Secondary | ICD-10-CM | POA: Diagnosis not present

## 2023-06-22 DIAGNOSIS — F411 Generalized anxiety disorder: Secondary | ICD-10-CM | POA: Diagnosis not present

## 2023-12-03 ENCOUNTER — Other Ambulatory Visit: Payer: BC Managed Care – PPO

## 2023-12-03 DIAGNOSIS — F902 Attention-deficit hyperactivity disorder, combined type: Secondary | ICD-10-CM | POA: Diagnosis not present

## 2023-12-03 DIAGNOSIS — Z79899 Other long term (current) drug therapy: Secondary | ICD-10-CM | POA: Diagnosis not present

## 2023-12-07 ENCOUNTER — Ambulatory Visit: Payer: BC Managed Care – PPO | Admitting: Urology

## 2023-12-14 DIAGNOSIS — F902 Attention-deficit hyperactivity disorder, combined type: Secondary | ICD-10-CM | POA: Diagnosis not present

## 2023-12-21 ENCOUNTER — Other Ambulatory Visit: Payer: Self-pay | Admitting: *Deleted

## 2023-12-21 DIAGNOSIS — R972 Elevated prostate specific antigen [PSA]: Secondary | ICD-10-CM

## 2023-12-26 ENCOUNTER — Other Ambulatory Visit: Payer: BC Managed Care – PPO

## 2023-12-26 DIAGNOSIS — R972 Elevated prostate specific antigen [PSA]: Secondary | ICD-10-CM

## 2023-12-27 LAB — PSA: Prostate Specific Ag, Serum: 1.3 ng/mL (ref 0.0–4.0)

## 2023-12-28 ENCOUNTER — Ambulatory Visit: Payer: BC Managed Care – PPO | Admitting: Urology

## 2024-06-10 DIAGNOSIS — J45909 Unspecified asthma, uncomplicated: Secondary | ICD-10-CM | POA: Diagnosis not present

## 2024-06-10 DIAGNOSIS — F988 Other specified behavioral and emotional disorders with onset usually occurring in childhood and adolescence: Secondary | ICD-10-CM | POA: Diagnosis not present

## 2024-06-10 DIAGNOSIS — Z0000 Encounter for general adult medical examination without abnormal findings: Secondary | ICD-10-CM | POA: Diagnosis not present

## 2024-06-10 DIAGNOSIS — E782 Mixed hyperlipidemia: Secondary | ICD-10-CM | POA: Diagnosis not present

## 2024-06-10 DIAGNOSIS — F330 Major depressive disorder, recurrent, mild: Secondary | ICD-10-CM | POA: Diagnosis not present

## 2024-06-10 DIAGNOSIS — F5104 Psychophysiologic insomnia: Secondary | ICD-10-CM | POA: Diagnosis not present

## 2024-06-10 DIAGNOSIS — F1290 Cannabis use, unspecified, uncomplicated: Secondary | ICD-10-CM | POA: Diagnosis not present

## 2024-07-25 DIAGNOSIS — F902 Attention-deficit hyperactivity disorder, combined type: Secondary | ICD-10-CM | POA: Diagnosis not present

## 2024-07-25 DIAGNOSIS — Z79899 Other long term (current) drug therapy: Secondary | ICD-10-CM | POA: Diagnosis not present

## 2024-09-26 DIAGNOSIS — E782 Mixed hyperlipidemia: Secondary | ICD-10-CM | POA: Diagnosis not present

## 2024-09-26 DIAGNOSIS — F1090 Alcohol use, unspecified, uncomplicated: Secondary | ICD-10-CM | POA: Diagnosis not present

## 2024-09-26 DIAGNOSIS — F330 Major depressive disorder, recurrent, mild: Secondary | ICD-10-CM | POA: Diagnosis not present

## 2024-09-26 DIAGNOSIS — Z720 Tobacco use: Secondary | ICD-10-CM | POA: Diagnosis not present

## 2024-09-29 ENCOUNTER — Ambulatory Visit (HOSPITAL_COMMUNITY)
Admission: RE | Admit: 2024-09-29 | Discharge: 2024-09-29 | Disposition: A | Discharge status: Home / Self Care | Payer: Self-pay | Source: Ambulatory Visit | Attending: Cardiology | Admitting: Cardiology

## 2024-09-29 ENCOUNTER — Encounter: Payer: Self-pay | Admitting: Cardiology

## 2024-09-29 ENCOUNTER — Ambulatory Visit: Attending: Cardiovascular Disease | Admitting: Cardiology

## 2024-09-29 VITALS — BP 120/72 | HR 77 | Ht 74.0 in | Wt 218.1 lb

## 2024-09-29 DIAGNOSIS — E7800 Pure hypercholesterolemia, unspecified: Secondary | ICD-10-CM | POA: Diagnosis not present

## 2024-09-29 DIAGNOSIS — Z720 Tobacco use: Secondary | ICD-10-CM

## 2024-09-29 DIAGNOSIS — R5383 Other fatigue: Secondary | ICD-10-CM

## 2024-09-29 DIAGNOSIS — Z136 Encounter for screening for cardiovascular disorders: Secondary | ICD-10-CM | POA: Diagnosis not present

## 2024-09-29 NOTE — Patient Instructions (Signed)
 Medication Instructions:  Continue same medications  Lab Work: None ordered  Testing/Procedures: Coronary Calcium Score  Follow-Up: At [facility], you and your health needs are our priority.  As part of our continuing mission to provide you with exceptional heart care, our providers are all part of one team.  This team includes your primary Cardiologist (physician) and Advanced Practice Providers or APPs (Physician Assistants and Nurse Practitioners) who all work together to provide you with the care you need, when you need it.  Your next appointment:  To Be Determined after test    Provider:  Dr.Jordan   We recommend signing up for the patient portal called MyChart.  Sign up information is provided on this After Visit Summary.  MyChart is used to connect with patients for Virtual Visits (Telemedicine).  Patients are able to view lab/test results, encounter notes, upcoming appointments, etc.  Non-urgent messages can be sent to your provider as well.   To learn more about what you can do with MyChart, go to [email or web address].

## 2024-09-29 NOTE — Progress Notes (Signed)
 Cardiology Office Note:    Date:  09/29/2024   ID:  Matthew Saunders, DOB [birth year], MRN [id number]  PCP:  Sun, Vyvyan, MD   [facility] Providers Cardiologist:  None     Referring MD: Cristopher Bottcher, NP   No chief complaint on file.   History of Present Illness:    Matthew Saunders is a 43 y.o. male is seen at the request of Cristopher Bottcher NP for evaluation of CV risk. History of HLD, tobacco and Etoh abuse.    He states he is here for preventative health. Denies any significant chest pain, dyspnea or palpitations. 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 Does a lot of traveling. Has 5 children. Doesn't sleep a lot. His work is physical loading trucks but does not get regular exercise. Smokes 1 pk/week. Drinks 6-12 drinks/week. Has been on lipitor for a couple of years with LDL from 166 to 110. Father had congenital heart disease and died from CHF. No real CAD history.   He was seen in 2017 in the ED with chest pain. Evaluation then was normal.   Past Medical History:  Diagnosis Date   Asthma    Depression    Family history of breast cancer 02/22/2021   Family history of leukemia 02/22/2021   Family history of lung cancer 02/22/2021   Family history of prostate cancer 02/22/2021   Family history of throat cancer 02/22/2021   FH: ovarian cancer 02/22/2021   Herniated disc     Past Surgical History:  Procedure Laterality Date   MOUTH SURGERY      Current Medications: Current Meds  Medication Sig   acyclovir cream (ZOVIRAX) 5 % Takes as needed   albuterol  (PROVENTIL  HFA;VENTOLIN  HFA) 108 (90 Base) MCG/ACT inhaler Inhale 1-2 puffs into the lungs every 6 (six) hours as needed for wheezing or shortness of breath.   atorvastatin (LIPITOR) 40 MG tablet SMARTSIG:1 Tablet(s) By Mouth Every Evening   cetirizine (ZYRTEC) 10 MG tablet Take 10 mg by mouth daily.   FLUoxetine (PROZAC) 10 MG capsule Take 1 capsule (10 mg total) by mouth daily.    fluticasone (FLONASE) 50 MCG/ACT nasal spray Place into both nostrils daily.   lidocaine  (LINDAMANTLE) 3 % CREA cream Apply 1 application topically as needed.   triamcinolone  cream (KENALOG ) 0.1 % Apply 1 application topically 2 (two) times daily. Apply for 2 weeks. May use on face     Allergies:   Aspirin and Penicillins   Social History   Socioeconomic History   Marital status: Married    Spouse name: Not on file   Number of children: 5   Years of education: Not on file   Highest education level: Not on file  Occupational History   Not on file  Tobacco Use   Smoking status: Former    Current packs/day: 0.25    Average packs/day: 0.3 packs/day for 30.8 years (7.7 ttl pk-yrs)    Types: Cigarettes    Start date: 1995   Smokeless tobacco: Never  Substance and Sexual Activity   Alcohol use: Yes    Alcohol/week: 6.0 standard drinks of alcohol    Types: 6 Cans of beer per week   Drug use: No   Sexual activity: Not on file  Other Topics Concern   Not on file  Social History Narrative   Chief operating officer.    Social Drivers of Corporate Investment Banker Strain: Not on file  Food Insecurity: Not on file  Transportation Needs: Not on file  Physical Activity: Not on file  Stress: Not on file  Social Connections: Not on file     Family History: The patient's family history includes Breast cancer in his paternal aunt; COPD in his mother; Cancer in his maternal grandmother and another family member; Colon cancer in an other family member; Esophageal cancer in an other family member; Heart disease in his father; Heart failure in his father; Leukemia in his paternal grandmother; Lung cancer in his paternal grandmother; Ovarian cancer in an other family member; Prostate cancer in his maternal grandfather, maternal uncle, and paternal grandfather; Stroke in his mother; Throat cancer in his maternal grandfather.  ROS:   Please see the history of present illness.     All other systems  reviewed and are negative.  EKGs/Labs/Other Studies Reviewed:    The following studies were reviewed today: EKG Interpretation Date/Time:  Monday September 29 2024 14:32:40 EST Ventricular Rate:  77 PR Interval:  156 QRS Duration:  78 QT Interval:  358 QTC Calculation: 405 R Axis:   57  Text Interpretation: Normal sinus rhythm Nonspecific T wave abnormality When compared with ECG of 20-Apr-2016 15:25, No significant change was found Confirmed by Jahnia Hewes [id number]) on 09/29/2024 2:35:02 PM   EKG Interpretation Date/Time:  Monday September 29 2024 14:32:40 EST Ventricular Rate:  77 PR Interval:  156 QRS Duration:  78 QT Interval:  358 QTC Calculation: 405 R Axis:   57  Text Interpretation: Normal sinus rhythm Nonspecific T wave abnormality When compared with ECG of 20-Apr-2016 15:25, No significant change was found Confirmed by Kelena Garrow [id number]) on 09/29/2024 2:35:02 PM    Recent Labs: No results found for requested labs within last 365 days.  Recent Lipid Panel No results found for: CHOL, TRIG, HDL, CHOLHDL, VLDL, LDLCALC, LDLDIRECT Dated 06/10/24: creatinine 1.19, GFR 77. CMET, CBC normal. Cholesterol 183, triglycerides 163, HDL 44, LDL 110.   Risk Assessment/Calculations:                Physical Exam:    VS:  BP 120/72 (BP Location: Left Arm, Patient Position: Sitting, Cuff Size: Large)   Pulse 77   Ht 6' 2 (1.88 m)   Wt 218 lb 1 oz (98.9 kg)   BMI 28.00 kg/m     Wt Readings from Last 3 Encounters:  09/29/24 218 lb 1 oz (98.9 kg)  12/01/22 222 lb (100.7 kg)  04/26/22 220 lb (99.8 kg)     GEN: Well nourished, well developed in no acute distress HEENT: Normal NECK: No JVD; No carotid bruits LYMPHATICS: No lymphadenopathy CARDIAC: RRR, no murmurs, rubs, gallops RESPIRATORY:  Clear to auscultation without rales, wheezing or rhonchi  ABDOMEN: Soft, non-tender, non-distended MUSCULOSKELETAL:  No edema; No deformity  SKIN: Warm and  dry NEUROLOGIC:  Alert and oriented x 3 PSYCHIATRIC:  Normal affect   ASSESSMENT:    1. Other fatigue   2. Hypercholesterolemia   3. Tobacco abuse    PLAN:    In order of problems listed above:  Hypercholesterolemia. On lipitor 40 mg daily. LDL 110. Discussed further risk evaluation with coronary CTA. If no calcification then I would continue Rx. If he does have coronary or aortic calcification would target LDL < 70. Also reviewed recommendations for heart healthy diet and regular aerobic exercise.  Tobacco abuse. Discussed importance of smoking cessation.            Medication Adjustments/Labs and Tests Ordered:  Current medicines are reviewed at length with the patient today.  Concerns regarding medicines are outlined above.  Orders Placed This Encounter  Procedures   CT CARDIAC SCORING (SELF PAY ONLY)   EKG 12-Lead   No orders of the defined types were placed in this encounter.   Patient Instructions  Medication Instructions:  Continue same medications  Lab Work: None ordered  Testing/Procedures: Coronary Calcium Score  Follow-Up: At [facility], you and your health needs are our priority.  As part of our continuing mission to provide you with exceptional heart care, our providers are all part of one team.  This team includes your primary Cardiologist (physician) and Advanced Practice Providers or APPs (Physician Assistants and Nurse Practitioners) who all work together to provide you with the care you need, when you need it.  Your next appointment:  To Be Determined after test    Provider:  Dr.Chenay Nesmith   We recommend signing up for the patient portal called MyChart.  Sign up information is provided on this After Visit Summary.  MyChart is used to connect with patients for Virtual Visits (Telemedicine).  Patients are able to view lab/test results, encounter notes, upcoming appointments, etc.  Non-urgent messages can be sent to your provider as well.    To learn more about what you can do with MyChart, go to [email or web address].      Signed, Chalmers Iddings, MD  09/29/2024 3:22 PM     [facility]

## 2024-09-30 ENCOUNTER — Ambulatory Visit: Payer: Self-pay | Admitting: Cardiology

## 2024-10-21 DIAGNOSIS — Z1211 Encounter for screening for malignant neoplasm of colon: Secondary | ICD-10-CM | POA: Diagnosis not present

## 2024-10-21 DIAGNOSIS — D125 Benign neoplasm of sigmoid colon: Secondary | ICD-10-CM | POA: Diagnosis not present

## 2024-10-21 DIAGNOSIS — K5730 Diverticulosis of large intestine without perforation or abscess without bleeding: Secondary | ICD-10-CM | POA: Diagnosis not present

## 2024-10-21 DIAGNOSIS — K648 Other hemorrhoids: Secondary | ICD-10-CM | POA: Diagnosis not present
